# Patient Record
Sex: Female | Born: 1965 | Race: White | Hispanic: No | Smoking: Current every day smoker
Health system: Southern US, Community
[De-identification: ages and names within clinical notes are randomized; demographics above are authoritative.]

## PROBLEM LIST (undated history)

## (undated) DIAGNOSIS — M545 Low back pain, unspecified: Secondary | ICD-10-CM

## (undated) DIAGNOSIS — G8929 Other chronic pain: Secondary | ICD-10-CM

## (undated) DIAGNOSIS — G43909 Migraine, unspecified, not intractable, without status migrainosus: Secondary | ICD-10-CM

## (undated) DIAGNOSIS — F418 Other specified anxiety disorders: Secondary | ICD-10-CM

## (undated) DIAGNOSIS — R569 Unspecified convulsions: Secondary | ICD-10-CM

## (undated) HISTORY — DX: Other specified anxiety disorders: F41.8

## (undated) HISTORY — PX: OTHER SURGICAL HISTORY: SHX169

## (undated) HISTORY — DX: Other chronic pain: G89.29

## (undated) HISTORY — DX: Migraine, unspecified, not intractable, without status migrainosus: G43.909

## (undated) HISTORY — DX: Low back pain: M54.5

## (undated) HISTORY — DX: Unspecified convulsions: R56.9

## (undated) HISTORY — DX: Low back pain, unspecified: M54.50

---

## 2007-04-03 ENCOUNTER — Ambulatory Visit (HOSPITAL_COMMUNITY): Admission: RE | Admit: 2007-04-03 | Discharge: 2007-04-03 | Payer: Self-pay | Admitting: Neurosurgery

## 2007-04-18 ENCOUNTER — Encounter: Payer: Self-pay | Admitting: Neurosurgery

## 2007-04-30 ENCOUNTER — Inpatient Hospital Stay (HOSPITAL_COMMUNITY): Admission: RE | Admit: 2007-04-30 | Discharge: 2007-05-08 | Payer: Self-pay | Admitting: Interventional Radiology

## 2007-05-01 ENCOUNTER — Encounter (INDEPENDENT_AMBULATORY_CARE_PROVIDER_SITE_OTHER): Payer: Self-pay | Admitting: Neurosurgery

## 2007-08-16 ENCOUNTER — Ambulatory Visit (HOSPITAL_COMMUNITY): Admission: RE | Admit: 2007-08-16 | Discharge: 2007-08-16 | Payer: Self-pay | Admitting: Neurosurgery

## 2009-06-17 IMAGING — CT CT HEAD W/O CM
1 series · 16 of 28 positions shown, 20 images · IV contrast (agent unspecified)
Comparison: 05/03/07.

CLINICAL DATA: Intracranial mass.  Status post resection.  
HEAD CT WITHOUT CONTRAST:
TECHNIQUE: Contiguous axial images were obtained from the base of the skull through the vertex according to standard protocol without contrast.

[Series 2: brain · axial · 0.42mm/px · z∈[+119,+247]mm · 16 of 28 slices shown, 20 images]
[im 2/28  brain]
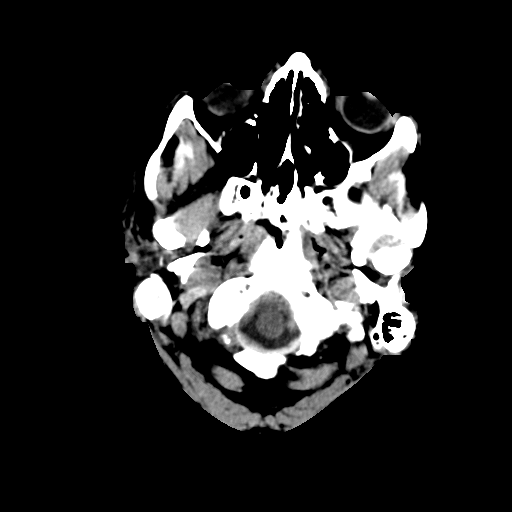
[im 2/28  bone]
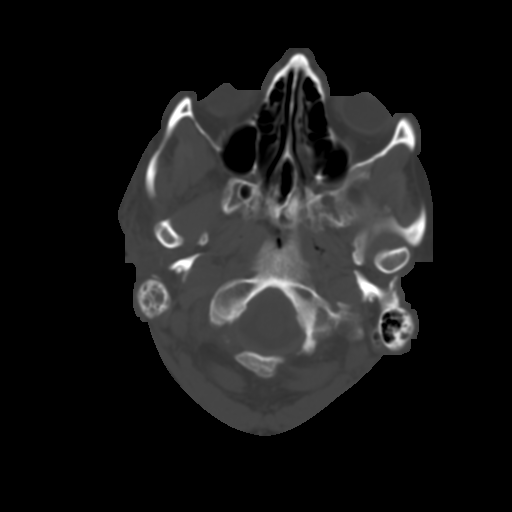
[im 4/28  brain]
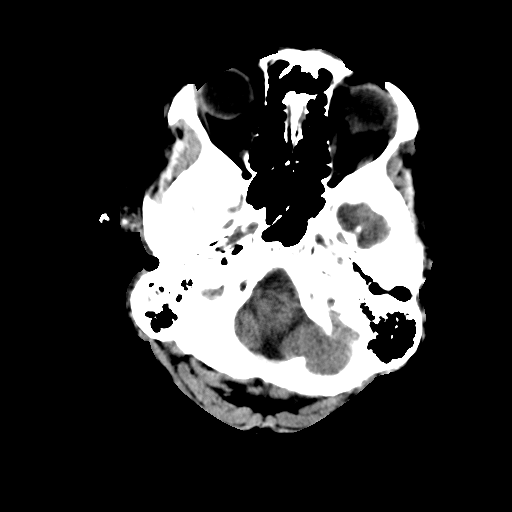
[im 6/28  brain]
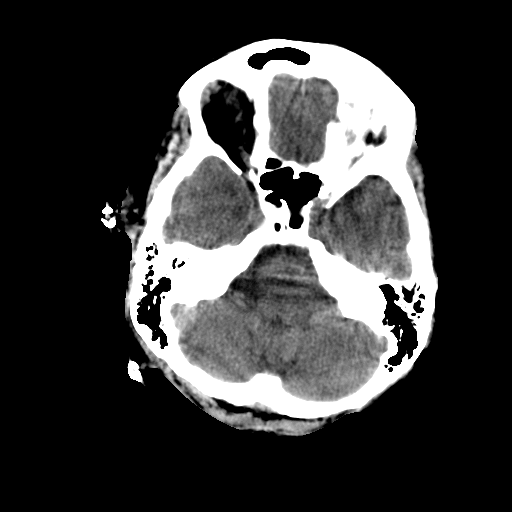
[im 7/28  brain]
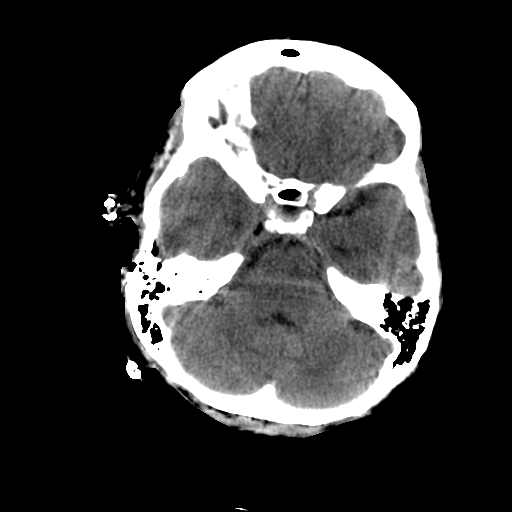
[im 9/28  brain]
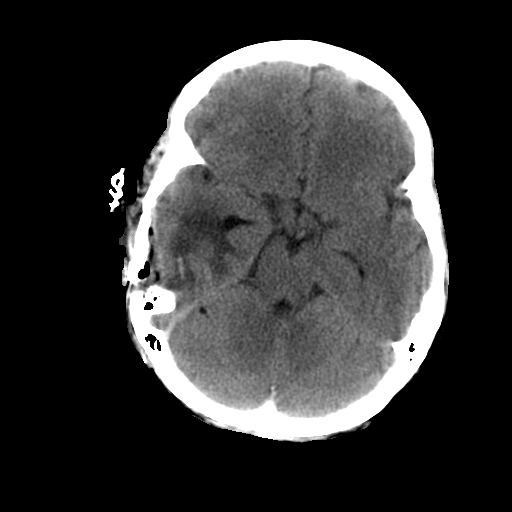
[im 9/28  bone]
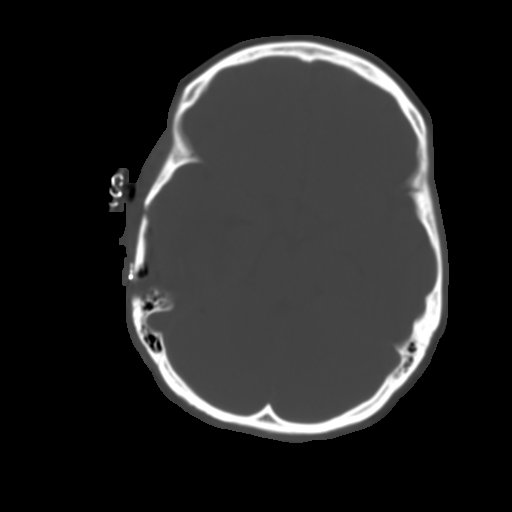
[im 10/28  brain]
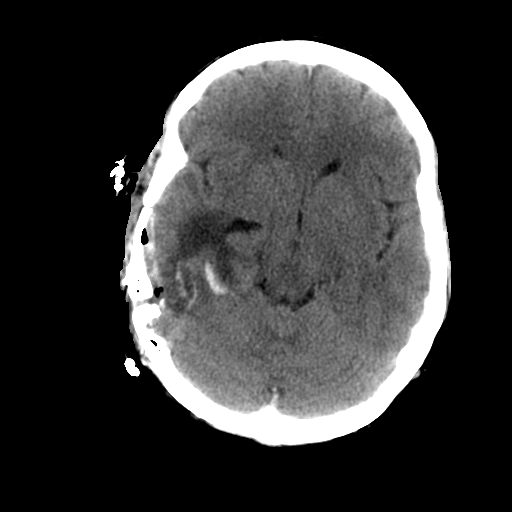
[im 12/28  brain]
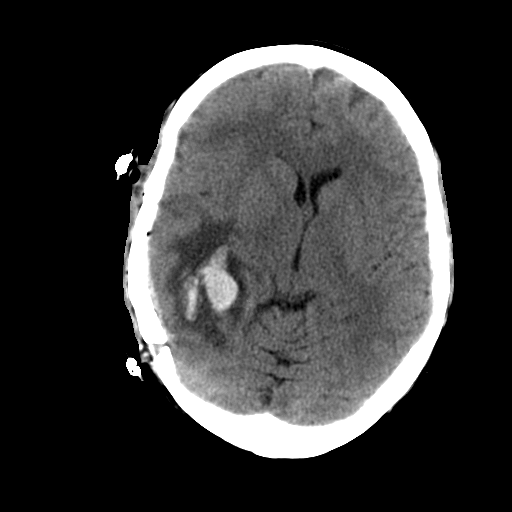
[im 14/28  brain]
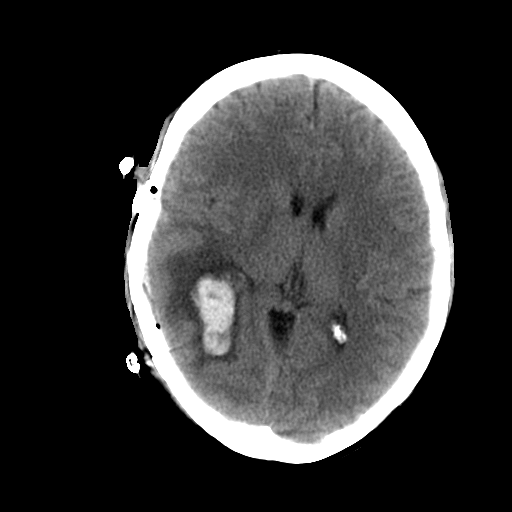
[im 15/28  brain]
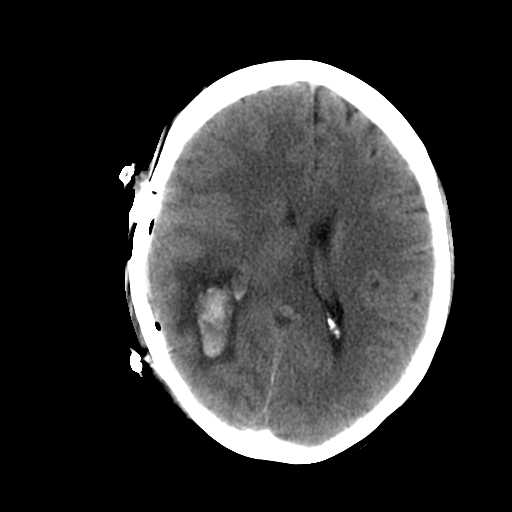
[im 15/28  bone]
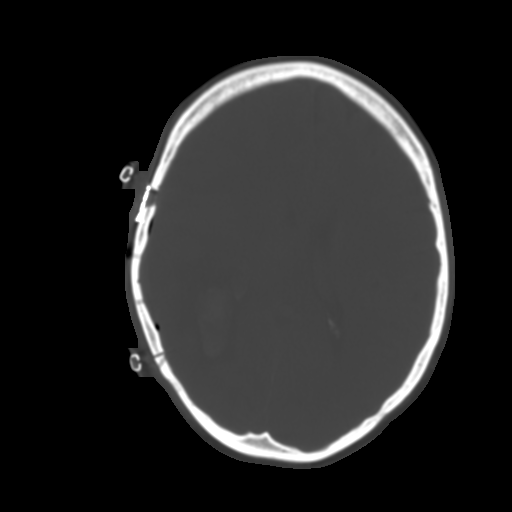
[im 17/28  brain]
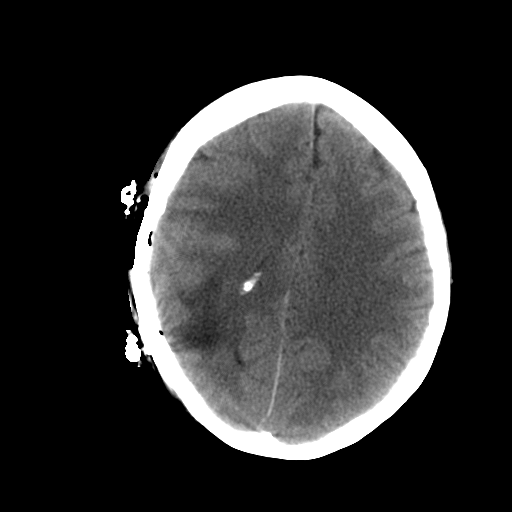
[im 19/28  brain]
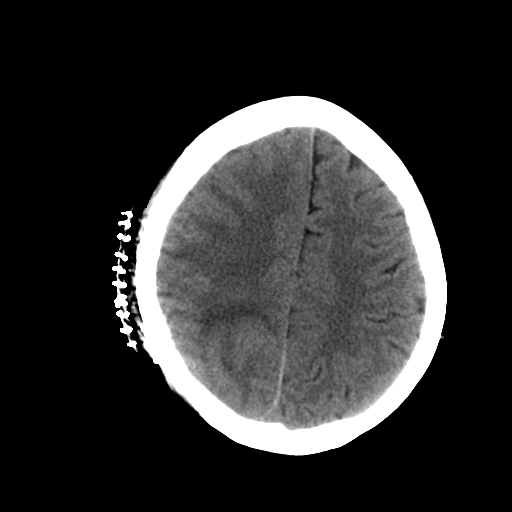
[im 20/28  brain]
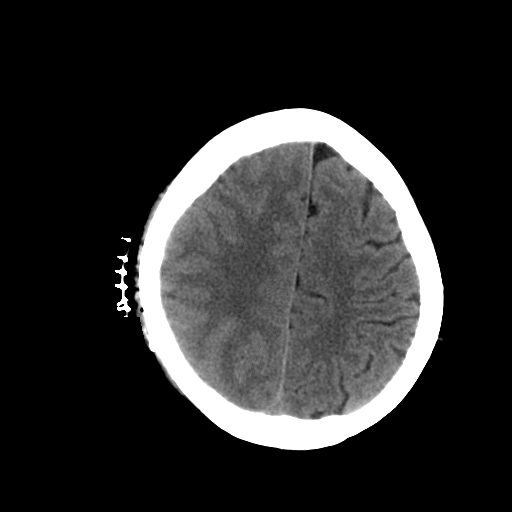
[im 22/28  brain]
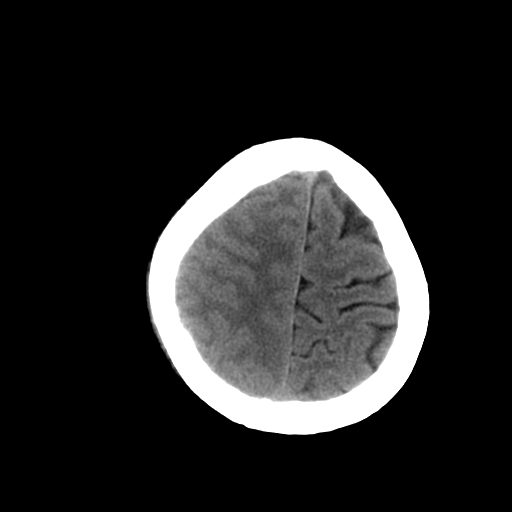
[im 22/28  bone]
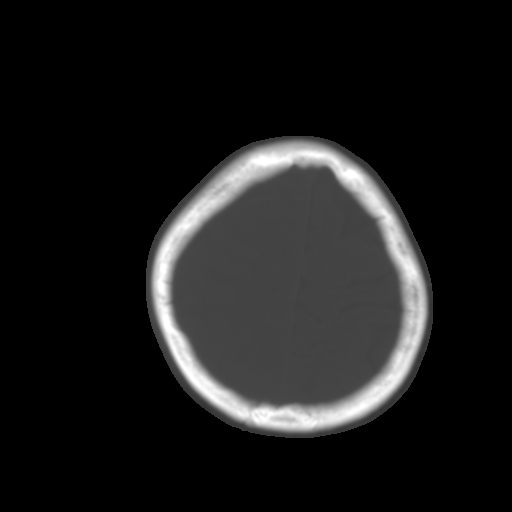
[im 23/28  brain]
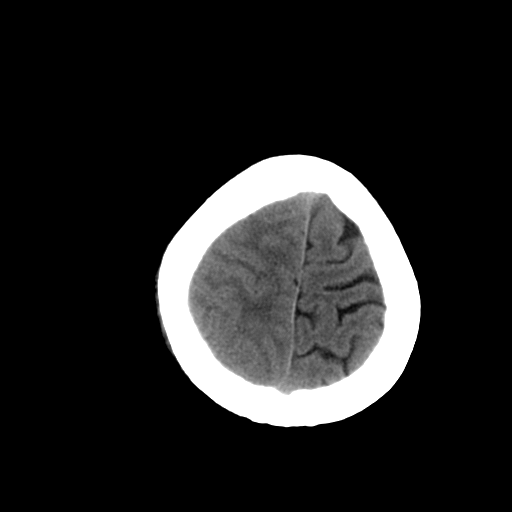
[im 25/28  brain]
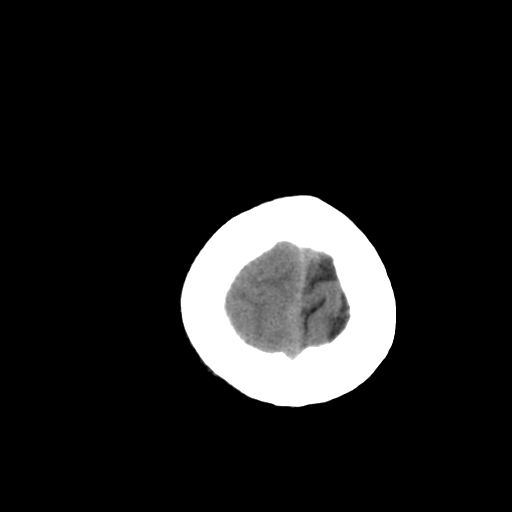
[im 27/28  brain]
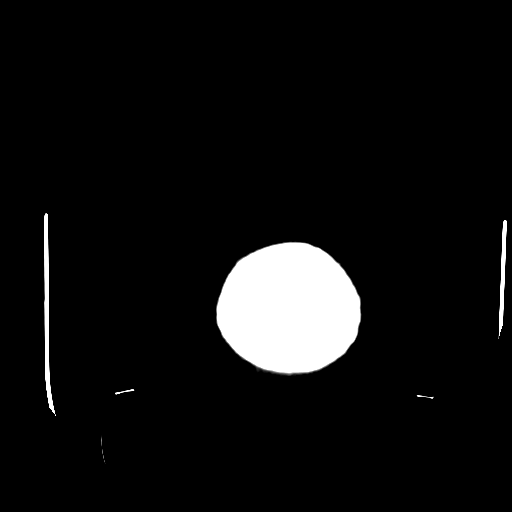

[16 of 28 positions shown; findings below may reference images not displayed]

FINDINGS: Again seen is postoperative change of right temporal craniotomy.  Hematoma in the right temporal lobe extending into the parietal lobe is not markedly changed to slightly decreased.  Surrounding edema is again noted.  No definite intraventricular blood.  Right to left midline shift of 0.6 cm is unchanged.  Prominence of the right temporal horn of the lateral ventricle suggesting entrapment is again noted.  Slight asymmetry of the perimesencephalic cistern is also unchanged.  No definite uncal herniation.  Basal cisterns are patent.  No new hemorrhage.  Bones again demonstrate craniotomy defect.
IMPRESSION: No marked interval change in right temporoparietal hematoma with associated right to left midline shift and potential entrapment of the temporal horn of the right lateral ventricle.

## 2009-12-14 ENCOUNTER — Emergency Department (HOSPITAL_COMMUNITY): Admission: EM | Admit: 2009-12-14 | Discharge: 2009-12-14 | Payer: Self-pay | Admitting: Emergency Medicine

## 2010-09-05 LAB — URINE CULTURE: Colony Count: 15000

## 2010-09-05 LAB — URINALYSIS, ROUTINE W REFLEX MICROSCOPIC
Glucose, UA: NEGATIVE mg/dL
Ketones, ur: NEGATIVE mg/dL
Nitrite: NEGATIVE
Specific Gravity, Urine: 1.031 — ABNORMAL HIGH (ref 1.005–1.030)
Urobilinogen, UA: 0.2 mg/dL (ref 0.0–1.0)
pH: 5 (ref 5.0–8.0)

## 2010-09-05 LAB — POCT I-STAT, CHEM 8
BUN: 17 mg/dL (ref 6–23)
Calcium, Ion: 1.15 mmol/L (ref 1.12–1.32)
Chloride: 106 mEq/L (ref 96–112)
Creatinine, Ser: 1 mg/dL (ref 0.4–1.2)
HCT: 45 % (ref 36.0–46.0)
Hemoglobin: 15.3 g/dL — ABNORMAL HIGH (ref 12.0–15.0)

## 2010-09-05 LAB — URINE MICROSCOPIC-ADD ON

## 2010-11-02 NOTE — H&P (Signed)
NAMEPAMELA, INTRIERI                 ACCOUNT NO.:  [id number]   MEDICAL RECORD NO.:  [id number]          PATIENT TYPE:  AMB   LOCATION:  SDS                          FACILITY:  [facility]   PHYSICIAN:  Marin Roberts, MDDATE OF BIRTH:  01/30/1966   DATE OF ADMISSION:  04/30/2007  DATE OF DISCHARGE:                              HISTORY & PHYSICAL   CHIEF COMPLAINT:  Meningioma, with plans for embolization of the  vascular supply.   HISTORY OF PRESENT ILLNESS:  This is a pleasant 45 year old female  referred to Dr. Marin Roberts through the courtesy of Dr. [name]  after the patient was diagnosed with a meningioma.  The patient has a  long history of migraine headaches which started at approximately age 56  or 18.  The patient had an MRI at [facility] in April  2008 that revealed a meningioma.  The plans were to follow this  conservatively.  She had a repeat MRI on April 03, 2007 after  developing changes in her headache pain.  This MRI revealed a doubling  in size of the meningioma, which now measures 21.7 x 22.5 x 14.7 mm and  is located in the right posterior area.  The plans are for surgery.  The  patient was referred to Dr. Alfredo Batty to discuss embolization of the  vascular supply.  Dr. Alfredo Batty met with the patient on April 18, 2007.  The embolization procedure was described in detail, along with the risks  and benefits.  The patient returns today, April 30, 2007, to be  admitted for a cerebral angiogram and possible embolization if felt to  be safe and indicated.   PAST MEDICAL HISTORY:  1. Degenerative joint disease, with chronic low back pain.  2. She has 2 herniated discs.  3. She has a history of anxiety and depression.  4. She has a history of chronic migraines.  5. She has a long history of tobacco use.  6. She has a history of a radiofrequency ablation for a cardiac      arrhythmia performed in 2003.   SURGICAL HISTORY:  1. Tonsillectomy and  adenoidectomy.  2. Nasal septal repair.   ALLERGIES:  The patient is allergic to NICOTINE PATCHES.   MEDICATIONS:  1. Depo shots q.3 months for birth control.  2. Lexapro 20 mg daily.  3. Ativan 1 mg b.i.d. to  t.i.d. p.r.n.  4. Vicodin 10/500, 2 daily.  5. Soma 350 mg 2-3 times daily.  6. Excedrin Migraine p.r.n.  7. Topamax 200 mg daily.  8. Ambien p.r.n. for sleep.  9. Phenergan p.r.n. nausea.  10.Zyrtec p.r.n.   SOCIAL HISTORY:  The patient is divorced.  She has one child.  She lives  in [place] with her ex-in-laws.  She smokes 1/2 to 1 pack of  cigarettes per day.  She uses alcohol occasionally.  She works as an  Air cabin crew.   FAMILY HISTORY:  There is no family history of meningioma, although she  has 2 great-aunts who had malignant brain tumors.   REVIEW OF SYSTEMS:  Is completely negative, except for  ongoing  headaches.  She has nausea and vomiting associated with her migraines.  She has had some visual changes which she attributes to getting older.  She has had diarrhea recently which she feels is due to anxiety.   LABORATORY DATA:  INR 0.9, PTT 29.  Hemoglobin 12.1, hematocrit 36.3,  WBC 7,200, platelets 302,000.  BUN 10, creatinine 0.74.  GFR is greater  than 60.  Chloride 113, glucose 90, potassium 4.5.  A serum pregnancy  test was negative.   PHYSICAL EXAMINATION:  GENERAL:  Reveals a very pleasant 45 year old  white female in no acute distress.  VITAL SIGNS:  Blood pressure 106/74, pulse 80, respirations 20,  temperature 97, oxygen saturation 98% on room air.  HEENT:  Unremarkable.  NECK:  Reveals no bruits.  HEART:  Reveals regular rate and rhythm, without murmur.  LUNGS:  Clear.  ABDOMEN:  Nontender.  EXTREMITIES:  Reveal pulses to be intact, without edema.  AIRWAY:  Rated at a 1.  ASA SCALE:  Rated at a 2.  NEUROLOGIC:  Revealed the patient to be alert and oriented and following  commands.  Cranial nerves II-XII were grossly intact.   Sensation was  intact to light touch.  Motor strength 5/5 throughout.  Cerebellar  testing was intact.   IMPRESSION:  1. Meningioma, with a recent doubling in size based on an MRI      performed April 03, 2007, with plans for craniotomy and      resection.  Cerebral angiogram to be performed today, with possible      embolization of the vascular supply prior to surgery.  2. History of chronic low back pain, with degenerative joint disease      and herniated discs.  3. History of anxiety and depression.  4. Chronic migraines.  5. Status post previous surgeries, as noted above.  6. Long history of tobacco use.  7. Status post radiofrequency ablation for cardiac arrhythmia in 2003.      Details not available.   PLAN:  As noted, the patient will have a cerebral angiogram today.  Based on those findings, she may require embolization of the vascular  supply of the meningioma prior to the planned resection.      Delton See, P.A.      Marin Roberts, MD  Electronically Signed    DR/MEDQ  D:  04/30/2007  T:  04/30/2007  Job:  [id number]   cc:   Danae Orleans. [name], M.D.  Pasty Spillers. Ivory Broad, M.D.

## 2010-11-02 NOTE — Consult Note (Signed)
Lindsay Newman, Lindsay Newman NO.:  [id number]   MEDICAL RECORD NO.:  [id number]          PATIENT TYPE:  INP   LOCATION:  NA                           FACILITY:  [facility]   PHYSICIAN:  Marin Roberts, MDDATE OF BIRTH:  09/22/1965   DATE OF CONSULTATION:  DATE OF DISCHARGE:                                 CONSULTATION   REFERRING PHYSICIAN:  Danae Orleans. [name], M.D.   CHIEF COMPLAINT:  The patient is referred to [place] by Dr. Danae Orleans. [name]  for consultation for embolization of vascular supply feeding meningoma  prior to open surgery.   HISTORY OF PRESENT ILLNESS:  The patient has a long-standing history of  headaches, specifically migraines.  The patient states she has suffered  from migraine headaches since about the age of 89 or 24.  She states that  her migraines have become worse severe, occurring on a daily basis and  has been unable to find any relief despite multiple medications.  Her  headaches are associated with nausea and vomiting.  She does describe  some visual changes but feels that they may be more due to her age than  due to her migraines.  The patient underwent MRI scan performed on  April 03, 2007, which was compared with outside films from [facility] date September 20, 2006, which reveals a doubly in size.  It was decided due to the change in her meningioma that the patient  should undergo craniotomy to excise this meningioma as well as  endovascular embolization prior to same to control bleeding, to  approximately double the original.   PAST MEDICAL HISTORY:  1. Positive for degenerative joint disease with 2 bulging herniated      disks giving her chronic lower back pain.  2. Anxiety and depression.  3. Chronic migraines as discussed in the HPI.   ALLERGIES:  NO KNOWN DRUG ALLERGIES.   PAST SURGICAL HISTORY:  1. Positive for adenoidectomy.  2. Nasal septal repair.  3. Heart ablation in 2003 secondary to arrhythmia.    MEDICATIONS:  1. Depo shot every 3 months for birth control.  2. Lexapro 20 mg daily for anxiety.  3. Ativan 1 mg b.i.d. to t.i.d. p.r.n. anxiety.  4. Vicodin 10/500 mg 2 times daily.  5. Soma 350 mg 2 to 3 times daily as needed for muscle relaxer.  6. Excedrin on a p.r.n. basis.  7. Topamax 200 mg daily.   SOCIAL HISTORY:  The patient is currently divorced.  She has 1 child.  She lives in [place] currently with her ex-in-laws.  The patient  smokes approximately 1/2 to 1 pack per day cigarettes.  She uses alcohol  on a social basis.  The patient denies recreational drug use.  The  patient currently works as an account.   FAMILY HISTORY:  No history from mother and father of any meningioma.  Patient states that she has 2 great aunts with his of malignant brain  tumors.   REVIEW OF SYSTEMS:  All negative except what is expressed in the history  of present illness.  Procedure for endovascular embolization of meningoma was discussed with  the patient in detail by Dr. Gennette Pac. The patient was informed the  benefits of the procedure would be that to control bleeding prior to  craniotomy procedure.  Complications of the procedure including the  potential for infection, bleeding, vessel damage, embolus and stroke  were discussed with the patient's apparent understanding.  The patient  understands that she will be brought in for a formal arteriogram to  evaluate the vascular supply to this meningioma.  If vessels are noted  to be amenable to the procedure, the patient would be put under general  anesthesia at that time and the endovascular embolization would occur.  All the patient's questions were answered and she is ready to proceed  with the procedure.  Anesthesia has been consulted and they will be  calling the patient to set up the patient for the embolization procedure  to be scheduled prior to her day of surgery with Dr. [name].      Michael Litter, P.A.       Marin Roberts, MD  Electronically Signed    PC/MEDQ  D:  04/18/2007  T:  04/19/2007  Job:  [id number]   cc:   Neale Burly, MD

## 2010-11-02 NOTE — Op Note (Signed)
**Note Lindsay Newman** NAMECARIGAN, LISTER                 ACCOUNT NO.:  [id number]   MEDICAL RECORD NO.:  [id number]          PATIENT TYPE:  OIB   LOCATION:  3113                         FACILITY:  [facility]   PHYSICIAN:  Danae Orleans. [name], M.D.  DATE OF BIRTH:  [birth year]   DATE OF PROCEDURE:  05/01/2007  DATE OF DISCHARGE:                               OPERATIVE REPORT   PREOPERATIVE DIAGNOSIS:  Right posterior temporal tentorial meningioma.   POSTOPERATIVE DIAGNOSIS:  Right posterior temporal tentorial meningioma.   PROCEDURES:  1. Placement of lumbar drain.  2. Posterior temporal craniotomy and resection of tentorial      meningioma.  3. Microdissection.   SURGEON:  Danae Orleans. [name], M.D.   ASSISTANTS:  1. Hewitt Shorts, M.D.  2. Georgiann Cocker, RN.   ANESTHESIA:  General endotracheal anesthesia.   ESTIMATED BLOOD LOSS:  300 mL.   COMPLICATIONS:  None.   DISPOSITION:  To recovery.   INDICATIONS:  Lindsay Newman is a 45 year old woman with a posterior  temporal tentorially based meningioma which had doubled in size on  interval scans over a 76-month period.  It was elected to take her to  surgery for craniotomy and resection of this tumor.  She was admitted  the day before for endovascular obliteration of her embolization of  feeding vessels to the tumor.   PROCEDURE:  Ms. Szczerba was brought to the operating room.  After  satisfactory smooth and uncomplicated induction of general endotracheal  anesthesia and placement of intravenous lines, the patient was placed in  a left lateral position.  Her low back was prepped and draped in the  usual sterile fashion and a lumbar drain was inserted with good return  of CSF.  Subsequently, she was placed in a park bench position with  axillary roll and soft tissue and bony prominences were padded  appropriately.  She was placed in three pin head fixation with the right  side of her head elevated.  Her right periauricular scalp was shaved and  prepped and  draped in the usual sterile fashion.  An incision was made,  marked overlying just anterior to the ear, curving up over the temporal  squama and then posteriorly to overlying the postauricular region in the  region of the mastoid air cells.  This was then infiltrated with 0.25%  Marcaine, 0.5% lidocaine and 1:200,000 epinephrine.  This incision was  then made, carried to the temporalis fascia.  Raney clips were applied.  The temporalis fascia was then incised sharply and then the temporalis  muscle was cut with the electrocautery.  The scalp flap was brought down  overlying the ear to the level of the base of the skull.  The craniotome  was then used to produce trephines anterosuperiorly, posteriorly and  posteroinferiorly.  The transverse sinus was identified and protected.  A bone flap was then elevated and an additional bony removal along the  base of the cell was performed with a high-speed drill and air cells  were encountered and packed with wax.  The dura was then opened and  after dural tack-up stitches  were placed and the floor of the temporal  fossa was identified, there were large draining veins which were  preserved.  The vein of Labbe was identified and preserved.  The  transverse sinus was also protected.  The posterior temporal lobe was  then elevated after drainage of CSF and drainage of the lumbar drain.  The patient had been mildly hyperventilated and also been given  mannitol.  Budde halo retractor system was employed and up on placing  the retractor blade, the tumor was identified.  This was a fairly  vascular tumor.  It appeared to be soft which is likely secondary to  embolization of the vascular feeders.  The tumor was removed with  suction cautery technique and also utilizing the CUSA.  Preliminary  pathology was consistent with a meningioma.  Additional material was  sent for permanent section.  The base of the tumor along the tentorium  appeared to be quite  vascular and the blood supply was taken out with  bipolar electrocautery and the tumor was then completely debulked.  The  deepest part of the tumor was then identified and carefully removed and  there was a leash of fine posterior cerebral branch feeders which were  cauterized on their entrance to the tumor and the tumor was then removed  in its entirety.  The venous structures were inspected and felt to be in  good repair.  There was no evidence of any remaining tumor.  The dura  was identified all the way to the edge of the incised dura.  Hemostasis  was assured.  There was free flow of CSF.  The microscope was removed  and field dura was then closed.  A bone flap was replaced with plates  and the temporalis fascia was closed with 2-0 Vicryl sutures.  The galea  reapproximated with 2-0 Vicryl sutures and the skin edges were  approximated with staples.  The wound was dressed with a sterile  occlusive head wrap.  The lumbar drain was removed at the end of the  case.  The patient was extubated in the operating room and taken to  recovery in stable satisfactory condition, having tolerated the  procedure well.      Danae Orleans. [name], M.D.  Electronically Signed     JDS/MEDQ  D:  05/01/2007  T:  05/01/2007  Job:  [id number]

## 2010-11-02 NOTE — Discharge Summary (Signed)
NAMEANTONISHA, Lindsay Newman                 ACCOUNT NO.:  [id number]   MEDICAL RECORD NO.:  [id number]          PATIENT TYPE:  INP   LOCATION:  3040                         FACILITY:  [facility]   PHYSICIAN:  Danae Orleans. [name], M.D.  DATE OF BIRTH:  04-06-1966   DATE OF ADMISSION:  04/30/2007  DATE OF DISCHARGE:  05/08/2007                               DISCHARGE SUMMARY   REASON FOR ADMISSION:  Right tentorial meningioma which was enlarging in  size on serial MRI scans.   FINAL DIAGNOSIS:  Right tentorial meningioma which was enlarging in size  on serial MRI scans.   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  She had an MRI which  demonstrated a right temporal tentorial based meningioma measuring 17 x  14 x 9.5 mm.  She came back with a follow up evaluation and this  demonstrated significant interval enlargement in the tumor, and it was  elected to take her to surgery for resection of this tumor.  She was  admitted the day before surgery for endovascular embolization of  meningeal feeding blood vessels to the tumor, and this was done by Dr.  Gennette Pac without complication.  The patient was then taken to  surgery on May 01, 2007, nd underwent a right posterior temporal  craniotomy with resection of the tentorial meningioma with  microdissection.  The surgery was uncomplicated.  Postoperatively, she  was awake, alert, and appropriate on May 01, 2007.  However, on  May 02, 2007, she was complaining of a headache and was a little  bit confused.  On May 03, 2007, she was somewhat less oriented.  I  therefore obtained a CT scan which showed that she had developed an  interval blood clot just superior to the tumor resection cavity and that  she had some cerebral edema, and mass effect.  The patient was placed on  steroids, was made  n.p.o., and then gradually had clearing of  consciousness and complete resolution of her neurologic problems.  On  May 06, 2007, she was alert and appropriate with a mild left-sided  drift.  On May 07, 2007, she had no drift and was entirely  appropriate.  Had somewhat unsteady gait.  She was then managed in  conjunction with a physical therapist on the floor, and was felt to have  steady gait without problems with her ambulation, and did not require  any more physical therapy.  On May 08, 2007, she was awake, alert,  conversant.  Had no drift.  No visual problems.  No orientation  problems, and minimal headache.  She was discharged home at this point  with a Decadron taper, with Keppra 500 mg b.i.d., Pepcid 20 mg b.i.d.,  and Percocet 5/325 one to two every 4 to 6 hours as needed for pain.   Instructions were to take it easy at home.  No lifting, bending,  twisting, no driving.   To go back on her  admission medications of Lexapro 20 mg daily, Topamax  200 mg at bedtime, Ambien 10 mg at bedtime, hydrocodone as needed, Soma  350 mg two times daily as needed, lorazepam 1 mg as needed, Phenergan  25 mg as needed, Zyrtec 10 mg daily as needed.  She is advised to hold  on her Excedrin migraine formulation, and was cleared to get her Depo-  Provera injections.   She will follow up with me in the office in 6 days for staple removal.      Danae Orleans. [name], M.D.  Electronically Signed     JDS/MEDQ  D:  05/08/2007  T:  05/08/2007  Job:  [id number]

## 2011-03-29 LAB — BASIC METABOLIC PANEL
BUN: 10
BUN: 10
BUN: 7
CO2: 18 — ABNORMAL LOW
CO2: 18 — ABNORMAL LOW
CO2: 24
Calcium: 8.8
Calcium: 8.9
Calcium: 9.1
Calcium: 9.1
Chloride: 111
Chloride: 112
Chloride: 114 — ABNORMAL HIGH
Creatinine, Ser: 0.69
Creatinine, Ser: 0.76
Creatinine, Ser: 0.86
GFR calc Af Amer: >60
GFR calc Af Amer: >60
GFR calc Af Amer: >60
GFR calc non Af Amer: >60
GFR calc non Af Amer: >60
GFR calc non Af Amer: >60
Glucose, Bld: 124 — ABNORMAL HIGH
Glucose, Bld: 126 — ABNORMAL HIGH
Potassium: 3.1 — ABNORMAL LOW
Potassium: 3.3 — ABNORMAL LOW
Potassium: 4.5
Sodium: 140
Sodium: 140
Sodium: 141

## 2011-03-29 LAB — APTT: aPTT: 29

## 2011-03-29 LAB — POCT I-STAT 7, (LYTES, BLD GAS, ICA,H+H)
Bicarbonate: 17.4 — ABNORMAL LOW
Hemoglobin: 10.5 — ABNORMAL LOW
O2 Saturation: 100
Operator id: 122891
Patient temperature: 36.2
pH, Arterial: 7.278 — ABNORMAL LOW
pO2, Arterial: 506 — ABNORMAL HIGH

## 2011-03-29 LAB — DIFFERENTIAL
Basophils Absolute: 0
Basophils Absolute: 0
Basophils Absolute: 0.1
Basophils Relative: 0
Basophils Relative: 0
Eosinophils Absolute: 0 — ABNORMAL LOW
Eosinophils Absolute: 0 — ABNORMAL LOW
Eosinophils Absolute: 0.4
Eosinophils Relative: 0
Eosinophils Relative: 0
Eosinophils Relative: 5
Lymphocytes Relative: 47 — ABNORMAL HIGH
Lymphocytes Relative: 7 — ABNORMAL LOW
Lymphocytes Relative: 8 — ABNORMAL LOW
Lymphs Abs: 0.7
Lymphs Abs: 1
Monocytes Absolute: 0.3
Monocytes Absolute: 0.8
Monocytes Relative: 3
Monocytes Relative: 6
Neutro Abs: 11.6 — ABNORMAL HIGH
Neutro Abs: 8.9 — ABNORMAL HIGH
Neutrophils Relative %: 40 — ABNORMAL LOW
Neutrophils Relative %: 87 — ABNORMAL HIGH
Neutrophils Relative %: 90 — ABNORMAL HIGH

## 2011-03-29 LAB — CROSSMATCH
ABO/RH(D): A NEG
Antibody Screen: NEGATIVE

## 2011-03-29 LAB — CBC
HCT: 29.7 — ABNORMAL LOW
HCT: 31.4 — ABNORMAL LOW
Hemoglobin: 10.1 — ABNORMAL LOW
Hemoglobin: 10.8 — ABNORMAL LOW
MCHC: 33.4
MCHC: 33.9
MCHC: 34.3
MCV: 92.6
MCV: 93.6
Platelets: 215
Platelets: 244
RBC: 3.18 — ABNORMAL LOW
RBC: 3.39 — ABNORMAL LOW
RDW: 13.5
RDW: 13.6
RDW: 13.7
WBC: 13.4 — ABNORMAL HIGH
WBC: 9.9

## 2011-03-29 LAB — PROTIME-INR: Prothrombin Time: 11.9

## 2011-03-29 LAB — POTASSIUM: Potassium: 4.2

## 2011-03-29 LAB — HCG, SERUM, QUALITATIVE: Preg, Serum: NEGATIVE

## 2011-03-29 LAB — SODIUM: Sodium: 139

## 2011-03-29 LAB — ABO/RH: ABO/RH(D): A NEG

## 2012-01-28 IMAGING — CT CT HEAD W/O CM
1 series · 16 of 30 positions shown, 20 images · non-contrast
Comparison: 05/04/2007

CLINICAL DATA: Seizure/history of meningioma resection 2.5 years
ago.

CT HEAD WITHOUT CONTRAST
TECHNIQUE: Contiguous axial images were obtained from the base of
the skull through the vertex without contrast.

[Series 2: head_seq 4.5 h37s st · axial · 0.43mm/px · z∈[+1217,+1361]mm · 16 of 36 slices shown, 20 images]
[im 2/36  brain]
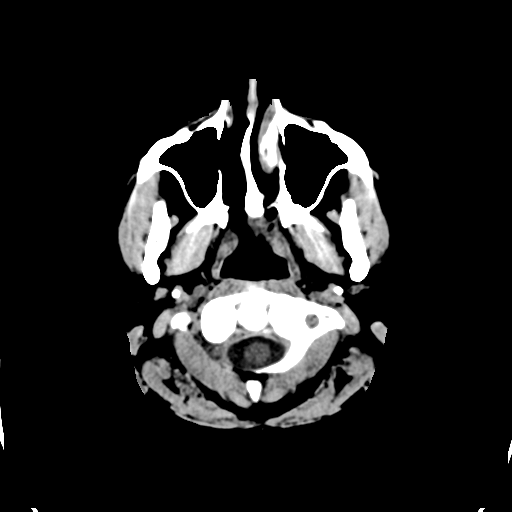
[im 2/36  bone]
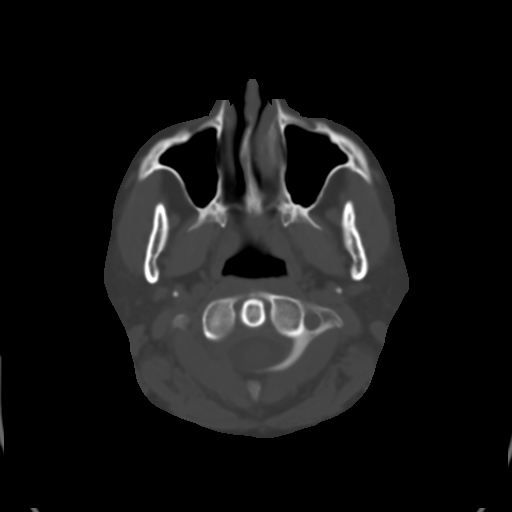
[im 4/36  brain]
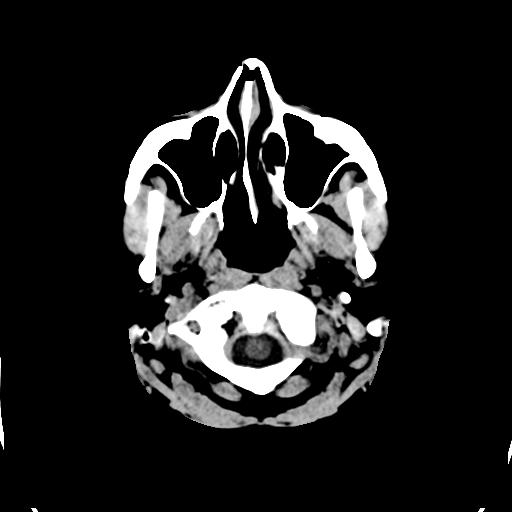
[im 7/36  brain]
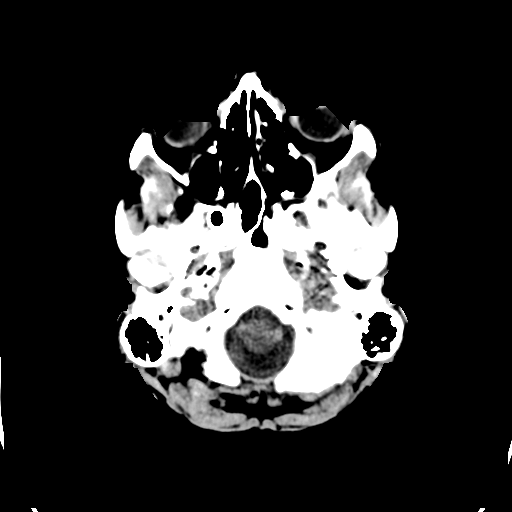
[im 9/36  brain]
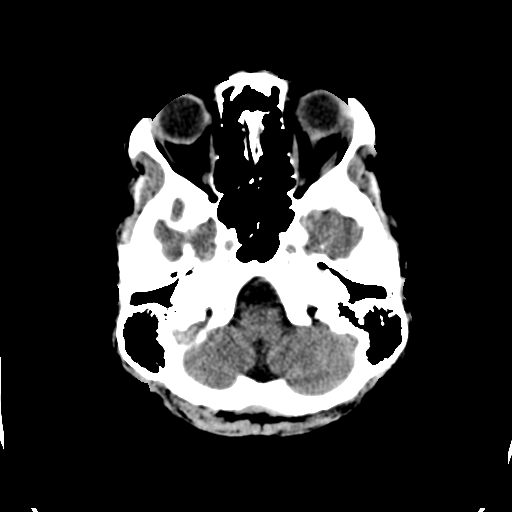
[im 10/36  brain]
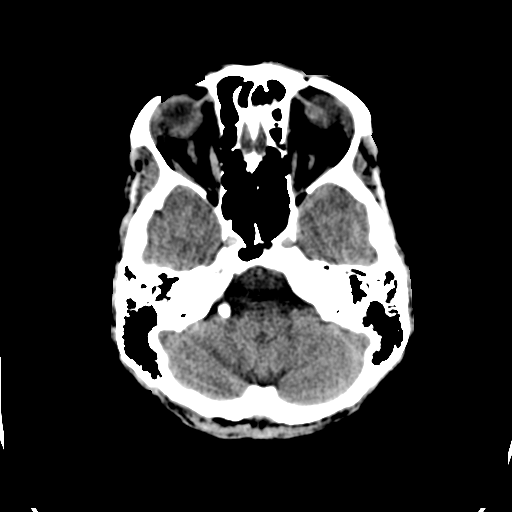
[im 10/36  bone]
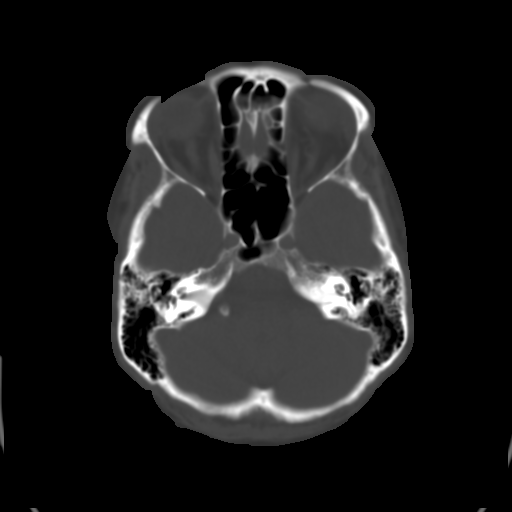
[im 13/36  brain]
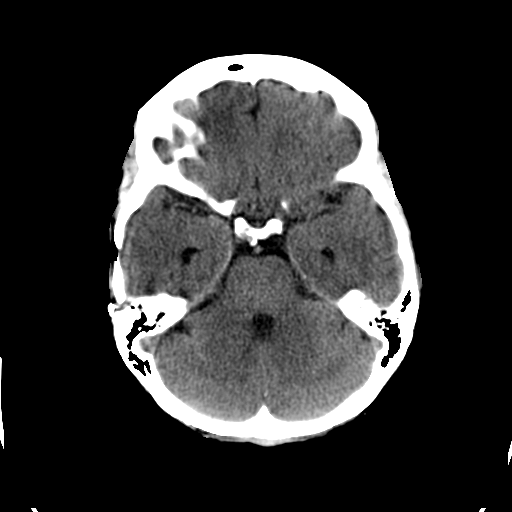
[im 15/36  brain]
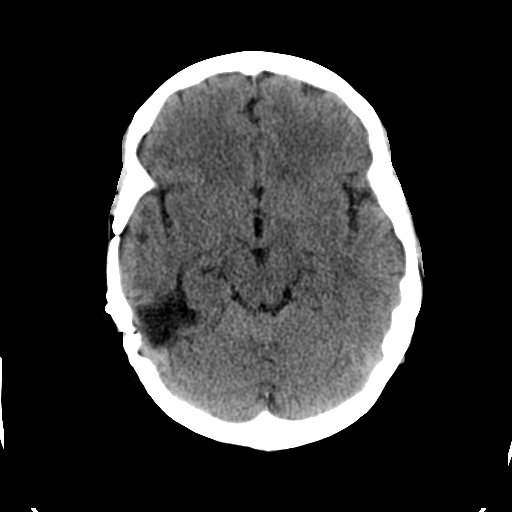
[im 17/36  brain]
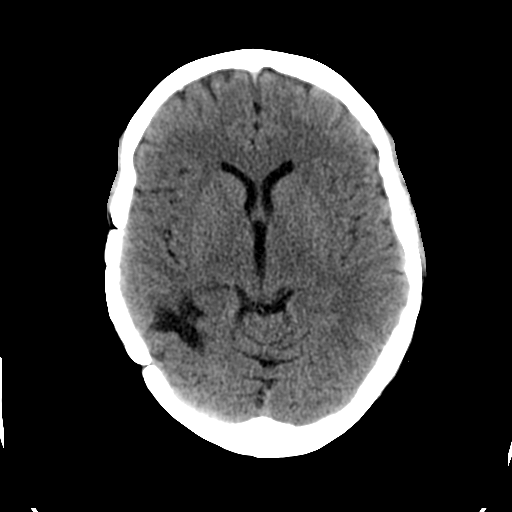
[im 19/36  brain]
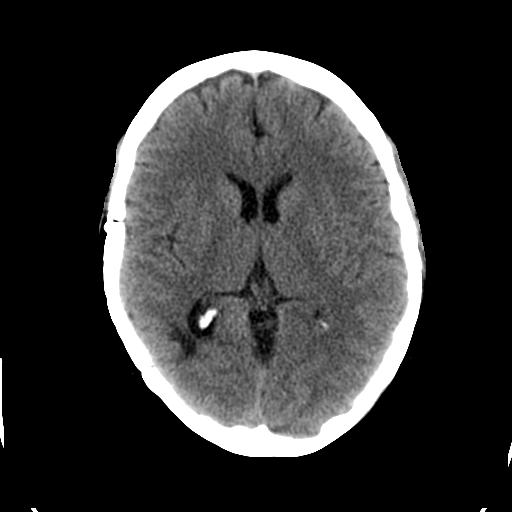
[im 19/36  bone]
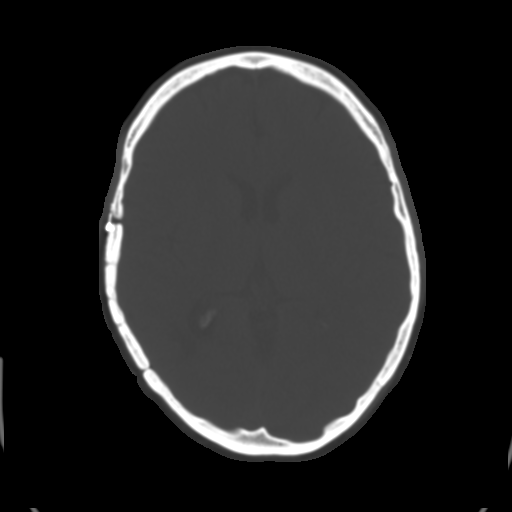
[im 21/36  brain]
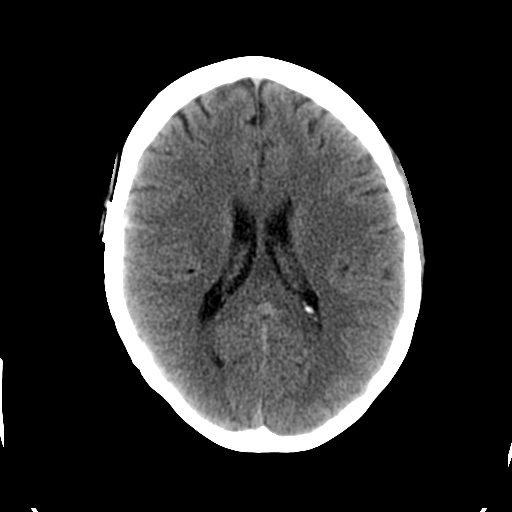
[im 23/36  brain]
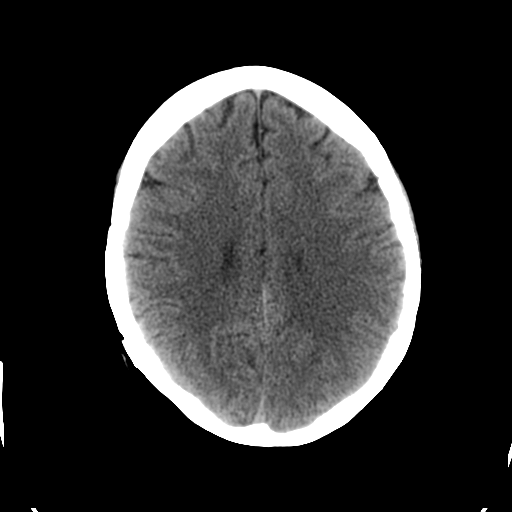
[im 26/36  brain]
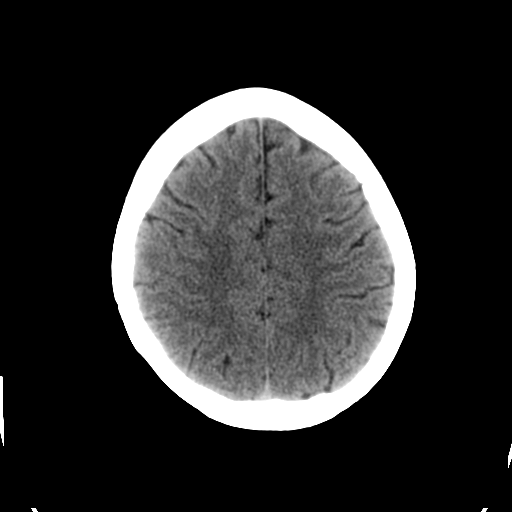
[im 27/36  brain]
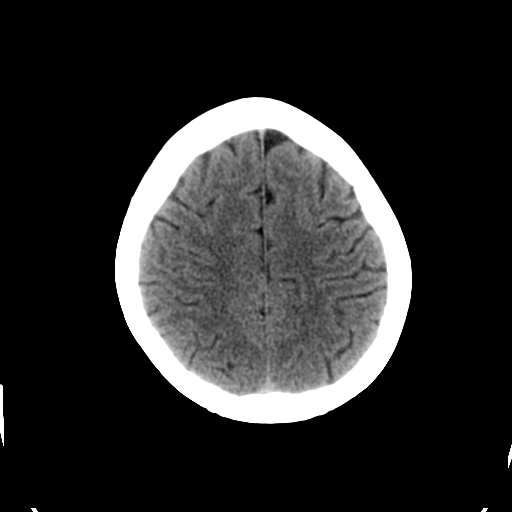
[im 27/36  bone]
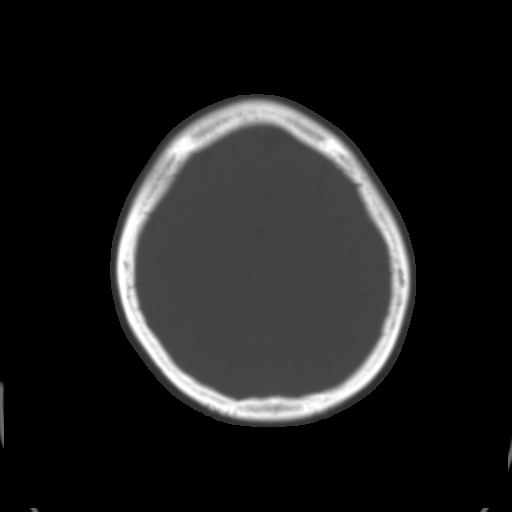
[im 29/36  brain]
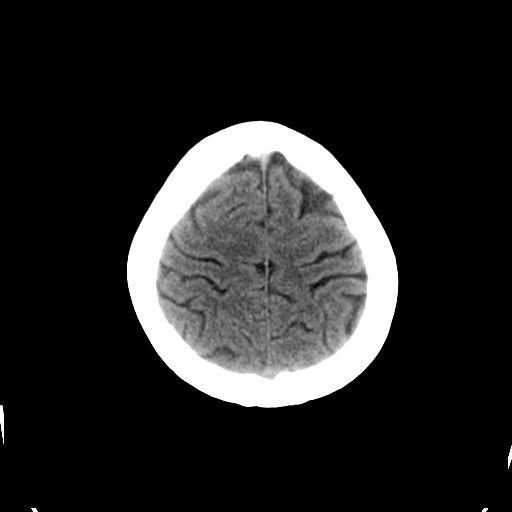
[im 32/36  brain]
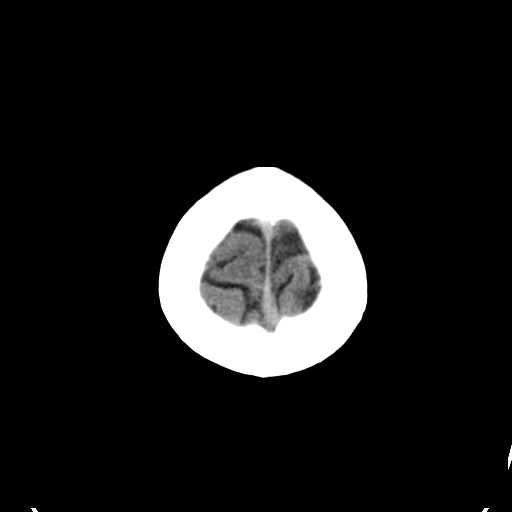
[im 34/36  brain]
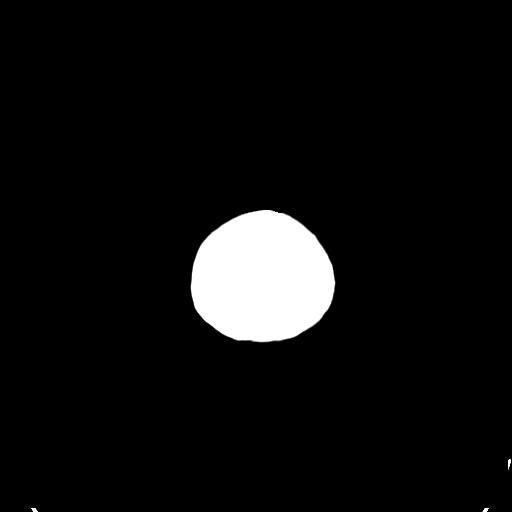

[16 of 30 positions shown; findings below may reference images not displayed]

FINDINGS: Status post right temporal parietal craniotomy.  There is
encephalomalacia at the operative site at the right posterior
temporal parietal region.  There is currently no mass effect or
midline shift.  No extra-axial fluid collections.  No intracranial
hemorrhage. Calvarium unremarkable except for the postoperative
changes on the right.  No fluid in the sinuses visualized.
IMPRESSION: 1.  Status post resection of meningioma from the right posterior
temporal parietal region.
2.  No acute findings.

## 2012-10-16 ENCOUNTER — Telehealth: Payer: Self-pay | Admitting: Neurology

## 2012-10-16 NOTE — Telephone Encounter (Signed)
I haven;t gotten anything from the pharmacy.  Patient will need to schedule annual appt.  I called the patient to see what pharmacy they use.  Got no answer.  Left message.

## 2012-10-16 NOTE — Telephone Encounter (Signed)
Patient left message that she needs a refill for her generic Keppra.  She said the pharmacy has sent 2 requests but hasn't heard anything.  Lindsay Newman it looks like she hasn't been seen in a year.  Let me know if I can do anything.

## 2012-10-17 MED ORDER — LEVETIRACETAM 500 MG PO TABS: ORAL_TABLET | ORAL | Status: DC

## 2012-10-17 NOTE — Telephone Encounter (Signed)
Patient called front desk and said she wants rx sent to [facility].  She needs enough to last until her appt on 06/27

## 2012-12-14 ENCOUNTER — Ambulatory Visit: Payer: Self-pay | Admitting: Nurse Practitioner

## 2013-03-06 ENCOUNTER — Encounter: Payer: Self-pay | Admitting: Nurse Practitioner

## 2013-03-07 ENCOUNTER — Encounter: Payer: Self-pay | Admitting: Nurse Practitioner

## 2013-03-07 ENCOUNTER — Ambulatory Visit (INDEPENDENT_AMBULATORY_CARE_PROVIDER_SITE_OTHER): Payer: Self-pay | Admitting: Nurse Practitioner

## 2013-03-07 VITALS — BP 131/80 | HR 74 | Ht 65.0 in | Wt 171.0 lb

## 2013-03-07 DIAGNOSIS — R413 Other amnesia: Secondary | ICD-10-CM | POA: Insufficient documentation

## 2013-03-07 DIAGNOSIS — D320 Benign neoplasm of cerebral meninges: Secondary | ICD-10-CM | POA: Insufficient documentation

## 2013-03-07 MED ORDER — LEVETIRACETAM 500 MG PO TABS: ORAL_TABLET | ORAL | Status: DC

## 2013-03-07 NOTE — Progress Notes (Signed)
[facility]  PATIENT: Lindsay Newman DOB: 10-28-1965   REASON FOR VISIT: follow up for seizure disorder   HISTORY OF PRESENT ILLNESS: Lindsay Newman , 47 year old returns for follow-up. She was last seen 10/11/11.  She is  alone at today's visit.  She has past medical history of depression, migraine headaches, right posterior temporal tentorial meningioma, status post resection by Dr. [name] in November 2008, presenting with complex partial seizure, with secondary generalization.   In 2008, she presenting with frequent headaches, imaging study has confirmed the meningioma, underwent resection uneventfully, which does help her headache, she did not have seizure, pre and post surgrically, until about 9 months ago, she began to have episode of acute onset of dizziness, stomach ache, lasting less than one minute, but there was no generalized tonic-clonic activity, until June 27.  She was talking with her Production assistant, radio, suddenly felt she is going to faint, lost consciousness  from then on, she was described to have generalized tonic-clonic seizure activity, last for one minute.  She was taken to the emergency room, CT of the brain has showed postsurgical changes at the right temporal region, no acute lesion,  she was started on Keppra 500 b.i.d., she tolerated it very well. TODAY:03/07/13: Last seizure was 02/02/10. She is currently taking  Keppra  500mg  am, 1000mg  hs. No further events. EEG was abnormal with slowing in right post craniotomy site. No epileptiform discharges seen.  She denies lateralized motor or sensory deficit, she is currently a Consulting civil engineer . She does not have medical insurance   REVIEW OF SYSTEMS: Full 14 system review of systems performed and notable only for:  Constitutional: N/A  Cardiovascular: N/A  Ear/Nose/Throat: N/A  Skin: N/A  Eyes: N/A  Respiratory: N/A  Gastroitestinal: N/A  Hematology/Lymphatic: N/A  Endocrine: N/A Musculoskeletal:N/A    Allergy/Immunology: N/A  Neurological: N/A Psychiatric: Anxiety not enough sleep, insomnia  ALLERGIES: Allergies  Allergen Reactions  . Keflex [Cephalexin]     HOME MEDICATIONS: Outpatient Prescriptions Prior to Visit  Medication Sig Dispense Refill  . levETIRAcetam (KEPPRA) 500 MG tablet One po qam and two po qhs  90 tablet  2  . LORazepam (ATIVAN) 1 MG tablet Take 1 mg by mouth every 8 (eight) hours as needed for anxiety.      Marland Kitchen zolpidem (AMBIEN) 10 MG tablet Take 10 mg by mouth at bedtime as needed for sleep.      Marland Kitchen escitalopram (LEXAPRO) 20 MG tablet Take 20 mg by mouth daily.       No facility-administered medications prior to visit.    PAST MEDICAL HISTORY: Past Medical History  Diagnosis Date  . Depression with anxiety   . Seizure   . Migraine   . Chronic pain   . Lower back pain     PAST SURGICAL HISTORY: Past Surgical History  Procedure Laterality Date  . Parietal meningeoma    . Ablation arthmia      FAMILY HISTORY: Family History  Problem Relation Age of Onset  . Heart disease      SOCIAL HISTORY: History   Social History  . Marital Status: Divorced    Spouse Name: N/A    Number of Children: 1  . Years of Education: college   Occupational History  . Not on file.   Social History Main Topics  . Smoking status: Current Every Day Smoker -- 1.00 packs/day    Types: Cigarettes  . Smokeless tobacco: Never Used  . Alcohol Use: Yes  Comment: social drinker   . Drug Use: No  . Sexual Activity: Not on file   Other Topics Concern  . Not on file   Social History Narrative   Patient is a Consulting civil engineer.   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   Patient has 2 years of college.   Patient is divorced.   Patient has 1 child.           PHYSICAL EXAM  Filed Vitals:   03/07/13 0912  BP: 131/80  Pulse: 74  Height: 5\' 5"  (1.651 m)  Weight: 171 lb (77.565 kg)   Body mass index is 28.46 kg/(m^2).  Generalized: Well developed, in no acute distress   Head: normocephalic and atraumatic,. Oropharynx benign  Neck: Supple, no carotid bruits  Cardiac: Regular rate rhythm, no murmur   Neurological examination   Mentation: Alert oriented to time, place, history taking. Follows all commands speech and language fluent  Cranial nerve II-XII: Pupils were equal round reactive to light extraocular movements were full, visual field were full on confrontational test. Facial sensation and strength were normal. hearing was intact to finger rubbing bilaterally. Uvula tongue midline. head turning and shoulder shrug and were normal and symmetric.Tongue protrusion into cheek strength was normal. Motor: normal bulk and tone, full strength in the BUE, BLE, fine finger movements normal, no pronator drift. No focal weakness Coordination: finger-nose-finger, heel-to-shin bilaterally, no dysmetria Reflexes: Brachioradialis 2/2, biceps 2/2, triceps 2/2, patellar 2/2, Achilles 2/2, plantar responses were flexor bilaterally. Gait and Station: Rising up from seated position without assistance, normal stance,  moderate stride, good arm swing, smooth turning, able to perform tiptoe, and heel walking without difficulty.   DIAGNOSTIC DATA (LABS, IMAGING, TESTING) - None to review  ASSESSMENT AND PLAN  47 y.o. year old female  has a past medical history of Depression with anxiety; Seizure; Migraine; Chronic pain; and Lower back pain. to followup for her seizure disorder that has been stable. Last seizure in 2011. Currently on Keppra without side effects  Continue Keppra at current dose will renew for the next year Followup yearly and when necessary  Nilda Riggs, [facility], [facility], APRN  [facility] [address], [address] [place], [place] [address] [phone]

## 2013-03-07 NOTE — Patient Instructions (Addendum)
Continue Keppra at current dose Will renew for the next year Followup yearly and. When necessary

## 2013-10-30 ENCOUNTER — Other Ambulatory Visit: Payer: Self-pay | Admitting: Nurse Practitioner

## 2014-03-07 ENCOUNTER — Ambulatory Visit: Payer: Self-pay | Admitting: Nurse Practitioner

## 2014-03-17 ENCOUNTER — Other Ambulatory Visit: Payer: Self-pay | Admitting: Neurology

## 2014-06-10 ENCOUNTER — Encounter: Payer: Self-pay | Admitting: Nurse Practitioner

## 2014-06-10 ENCOUNTER — Ambulatory Visit (INDEPENDENT_AMBULATORY_CARE_PROVIDER_SITE_OTHER): Payer: Self-pay | Admitting: Nurse Practitioner

## 2014-06-10 VITALS — BP 121/77 | HR 86 | Ht 64.0 in | Wt 154.4 lb

## 2014-06-10 DIAGNOSIS — G40209 Localization-related (focal) (partial) symptomatic epilepsy and epileptic syndromes with complex partial seizures, not intractable, without status epilepticus: Secondary | ICD-10-CM | POA: Insufficient documentation

## 2014-06-10 DIAGNOSIS — R413 Other amnesia: Secondary | ICD-10-CM

## 2014-06-10 MED ORDER — LEVETIRACETAM 500 MG PO TABS: ORAL_TABLET | ORAL | Status: DC

## 2014-06-10 NOTE — Patient Instructions (Signed)
Continue Keppra at current dose will renew for one year, Rx to patient Follow-up yearly and when necessary

## 2014-06-10 NOTE — Progress Notes (Signed)
[facility]  PATIENT: Lindsay Newman DOB: 25-May-1966   REASON FOR VISIT: Follow-up for seizure disorder  HISTORY OF PRESENT ILLNESS: Lindsay Newman , 48 year old returns for follow-up. She was last seen 10/11/11. She is alone at today's visit.  She has past medical history of depression, migraine headaches, right posterior temporal tentorial meningioma, status post resection by Dr. Vertell Limber in November 2008, presenting with complex partial seizure, with secondary generalization.  In 2008, she presenting with frequent headaches, imaging study has confirmed the meningioma, underwent resection uneventfully, which does help her headache, she did not have seizure, pre and post surgrically, until about 9 months ago, she began to have episode of acute onset of dizziness, stomach ache, lasting less than one minute, but there was no generalized tonic-clonic activity, until June 27. She was talking with her Special educational needs teacher, suddenly felt she is going to faint, lost consciousness from then on, she was described to have generalized tonic-clonic seizure activity, last for one minute.  She was taken to the emergency room, CT of the brain has showed postsurgical changes at the right temporal region, no acute lesion, she was started on Keppra 500 b.i.d., she tolerated it very well. TODAY:06/10/14: Lindsay Newman, 48 year old female returns for follow-up. She has a history of seizure disorder with last seizure occurring  02/02/10. She is currently taking Keppra 530m am, 10025mhs. No further events. EEG was abnormal with slowing in right post craniotomy site. No epileptiform discharges seen. She denies lateralized motor or sensory deficit. She returns for reevaluation and refills.    REVIEW OF SYSTEMS: Full 14 system review of systems performed and notable only for those listed, all others are neg:  Constitutional: N/A  Cardiovascular: N/A  Ear/Nose/Throat: N/A  Skin: N/A  Eyes: N/A    Respiratory: N/A  Gastroitestinal: N/A  Hematology/Lymphatic: N/A  Endocrine: N/A Musculoskeletal: Back pain  Allergy/Immunology: N/A  Neurological: Occasional headaches  Psychiatric: N/A Sleep : NA   ALLERGIES: Allergies  Allergen Reactions  . Keflex [Cephalexin]     HOME MEDICATIONS: Outpatient Prescriptions Prior to Visit  Medication Sig Dispense Refill  . levETIRAcetam (KEPPRA) 500 MG tablet TAKE 1 TABLET BY MOUTH EVERY MORNING AND 2 TABLETS AT BEDTIME 90 tablet 2  . LORazepam (ATIVAN) 1 MG tablet Take 1 mg by mouth every 8 (eight) hours as needed for anxiety.    . Marland Kitchenolpidem (AMBIEN) 10 MG tablet Take 10 mg by mouth at bedtime as needed for sleep.     No facility-administered medications prior to visit.    PAST MEDICAL HISTORY: Past Medical History  Diagnosis Date  . Depression with anxiety   . Seizure   . Migraine   . Chronic pain   . Lower back pain     PAST SURGICAL HISTORY: Past Surgical History  Procedure Laterality Date  . Parietal meningeoma    . Ablation arthmia      FAMILY HISTORY: Family History  Problem Relation Age of Onset  . Heart disease      SOCIAL HISTORY: History   Social History  . Marital Status: Divorced    Spouse Name: N/A    Number of Children: 1  . Years of Education: college   Occupational History  . book keTherapist, nutritional  Social History Main Topics  . Smoking status: Current Every Day Smoker -- 1.00 packs/day    Types: Cigarettes  . Smokeless tobacco: Never Used  . Alcohol Use: Yes     Comment: social drinker   .  Drug Use: No  . Sexual Activity: Not on file   Other Topics Concern  . Not on file   Social History Narrative   Patient is a Ship broker.   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   Patient has 2 years of college.   Patient is divorced.   Patient has 1 child.   Patient is left handed.   Patient drinks some caffeine daily.        PHYSICAL EXAM  Filed Vitals:   06/10/14 0859  BP: 121/77  Pulse: 86  Height: 5'  4" (1.626 m)  Weight: 154 lb 6.4 oz (70.035 kg)   Body mass index is 26.49 kg/(m^2). Generalized: Well developed, in no acute distress  Head: normocephalic and atraumatic,. Oropharynx benign   Neurological examination   Mentation: Alert oriented to time, place, history taking. Follows all commands speech and language fluent Cranial nerve II-XII: Pupils were equal round reactive to light extraocular movements were full, visual field were full on confrontational test. Facial sensation and strength were normal. hearing was intact to finger rubbing bilaterally. Uvula tongue midline. head turning and shoulder shrug and were normal and symmetric.Tongue protrusion into cheek strength was normal. Motor: normal bulk and tone, full strength in the BUE, BLE, fine finger movements normal, no pronator drift. No focal weakness Coordination: finger-nose-finger, heel-to-shin bilaterally, no dysmetria Reflexes: Brachioradialis 2/2, biceps 2/2, triceps 2/2, patellar 2/2, Achilles 2/2, plantar responses were flexor bilaterally. Gait and Station: Rising up from seated position without assistance, normal stance, moderate stride, good arm swing, smooth turning, able to perform tiptoe, and heel walking without difficulty.    DIAGNOSTIC DATA (LABS, IMAGING, TESTING) - I reviewed patient records, labs, notes, testing and imaging myself where available.  Lab Results  Component Value Date   WBC  05/04/2007    9.9 **Please note change in CBC/Diff reference range**   HGB 15.3* 12/14/2009   HCT 45.0 12/14/2009   MCV 92.6 05/04/2007   PLT 244 05/04/2007      Component Value Date/Time   NA 139 12/14/2009 1517   K 3.7 12/14/2009 1517   CL 106 12/14/2009 1517   CO2 19 05/04/2007 1900   GLUCOSE 107* 12/14/2009 1517   BUN 17 12/14/2009 1517   CREATININE 1.0 12/14/2009 1517   CALCIUM 8.9 05/04/2007 1900   GFRNONAA >60 05/04/2007 1900   GFRAA  05/04/2007 1900    >60        The eGFR has been calculated using  the MDRD equation. This calculation has not been validated in all clinical    ASSESSMENT AND PLAN  48 y.o. year old female  has a past medical history of Depression with anxiety; Seizure; Migraine; Chronic pain; and Lower back pain. here to follow-up for seizure disorder. Last seizure occurred in 2011.  Continue Keppra at current dose will renew for one year, Rx to patient Follow-up yearly and when necessary Dennie Bible, [facility], [facility], APRN  [facility] [address], [place], [place] [address] [phone]

## 2014-06-18 NOTE — Progress Notes (Signed)
I agree above plan. 

## 2014-06-27 NOTE — Progress Notes (Signed)
I agree above plan. 

## 2015-06-08 ENCOUNTER — Ambulatory Visit: Payer: Self-pay | Admitting: Nurse Practitioner

## 2015-06-16 ENCOUNTER — Ambulatory Visit (INDEPENDENT_AMBULATORY_CARE_PROVIDER_SITE_OTHER): Payer: Self-pay | Admitting: Nurse Practitioner

## 2015-06-16 ENCOUNTER — Encounter: Payer: Self-pay | Admitting: Nurse Practitioner

## 2015-06-16 VITALS — BP 113/81 | HR 82 | Ht 64.0 in | Wt 167.4 lb

## 2015-06-16 DIAGNOSIS — R413 Other amnesia: Secondary | ICD-10-CM

## 2015-06-16 DIAGNOSIS — G40209 Localization-related (focal) (partial) symptomatic epilepsy and epileptic syndromes with complex partial seizures, not intractable, without status epilepticus: Secondary | ICD-10-CM

## 2015-06-16 MED ORDER — LEVETIRACETAM 500 MG PO TABS: ORAL_TABLET | ORAL | Status: DC

## 2015-06-16 NOTE — Progress Notes (Signed)
I have reviewed and agreed above plan. 

## 2015-06-16 NOTE — Progress Notes (Signed)
[facility]  PATIENT: Lindsay Newman DOB: January 15, 1966   REASON FOR VISIT: Follow-up for seizure disorder, HISTORY FROM: Patient    HISTORY OF PRESENT ILLNESS: HISTORY: Lindsay Newman , 49 year old returns for follow-up. She was last seen 10/11/11. She is alone at today's visit. She has past medical history of depression, migraine headaches, right posterior temporal tentorial meningioma, status post resection by Dr. Vertell Limber in November 2008, presenting with complex partial seizure, with secondary generalization. In 2008, she presenting with frequent headaches, imaging study has confirmed the meningioma, underwent resection uneventfully, which does help her headache, she did not have seizure, pre and post surgrically, until about 9 months ago, she began to have episode of acute onset of dizziness, stomach ache, lasting less than one minute, but there was no generalized tonic-clonic activity, until June 27. She was talking with her Special educational needs teacher, suddenly felt she is going to faint, lost consciousness from then on, she was described to have generalized tonic-clonic seizure activity, last for one minute.  She was taken to the emergency room, CT of the brain has showed postsurgical changes at the right temporal region, no acute lesion, she was started on Keppra 500 b.i.d., she tolerated it very well. TODAY:06/16/15: Lindsay. Newman, 48 year old female returns for follow-up. She has a history of seizure disorder with last seizure occurring 02/02/10. She is currently taking Keppra 500mg  am, 1000mg  hs. No further events. EEG was abnormal with slowing in right post craniotomy site. No epileptiform discharges seen. She denies lateralized motor or sensory deficit. She returns for reevaluation and refills. No new medical issues    REVIEW OF SYSTEMS: Full 14 system review of systems performed and notable only for those listed, all others are neg:  Constitutional: neg    Cardiovascular: neg Ear/Nose/Throat: neg  Skin: neg Eyes: neg Respiratory: neg Gastroitestinal: neg  Hematology/Lymphatic: neg  Endocrine: neg Musculoskeletal joint pain Allergy/Immunology: Environmental allergies Neurological: History of seizure disorder Psychiatric: Depression and anxiety Sleep : neg   ALLERGIES: Allergies  Allergen Reactions  . Cortisone     Other reaction(s): INCREASE PAIN  . Duloxetine     Other reaction(s): DEPRESSION  . Keflex [Cephalexin]     HOME MEDICATIONS: Outpatient Prescriptions Prior to Visit  Medication Sig Dispense Refill  . levETIRAcetam (KEPPRA) 500 MG tablet TAKE 1 TABLET BY MOUTH EVERY MORNING AND 2 TABLETS AT BEDTIME 270 tablet 3   No facility-administered medications prior to visit.    PAST MEDICAL HISTORY: Past Medical History  Diagnosis Date  . Depression with anxiety   . Seizure ([place])   . Migraine   . Chronic pain   . Lower back pain     PAST SURGICAL HISTORY: Past Surgical History  Procedure Laterality Date  . Parietal meningeoma    . Ablation arthmia      FAMILY HISTORY: Family History  Problem Relation Age of Onset  . Heart disease    . Pancreatic cancer Mother     07-09-14    SOCIAL HISTORY: Social History   Social History  . Marital Status: Divorced    Spouse Name: N/A  . Number of Children: 1  . Years of Education: college   Occupational History  . book Therapist, nutritional    Social History Main Topics  . Smoking status: Current Every Day Smoker -- 1.00 packs/day    Types: Cigarettes  . Smokeless tobacco: Never Used  . Alcohol Use: Yes     Comment: social drinker   . Drug Use: No  .  Sexual Activity: Not on file   Other Topics Concern  . Not on file   Social History Narrative   Patient is a Ship broker.   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   Patient has 2 years of college.   Patient is divorced.   Patient has 1 child.   Patient is left handed.   Patient drinks some caffeine daily.        PHYSICAL  EXAM  Filed Vitals:   06/16/15 1131  BP: 113/81  Pulse: 82  Height: 5\' 4"  (1.626 m)  Weight: 167 lb 6.4 oz (75.932 kg)   Body mass index is 28.72 kg/(m^2). Generalized: Well developed, in no acute distress  Head: normocephalic and atraumatic,. Oropharynx benign   Neurological examination   Mentation: Alert oriented to time, place, history taking. Follows all commands speech and language fluent Cranial nerve II-XII: Pupils were equal round reactive to light extraocular movements were full, visual field were full on confrontational test. Facial sensation and strength were normal. hearing was intact to finger rubbing bilaterally. Uvula tongue midline. head turning and shoulder shrug and were normal and symmetric.Tongue protrusion into cheek strength was normal. Motor: normal bulk and tone, full strength in the BUE, BLE, fine finger movements normal, no pronator drift. No focal weakness Coordination: finger-nose-finger, heel-to-shin bilaterally, no dysmetria Reflexes: Brachioradialis 2/2, biceps 2/2, triceps 2/2, patellar 2/2, Achilles 2/2, plantar responses were flexor bilaterally. Gait and Station: Rising up from seated position without assistance, normal stance, moderate stride, good arm swing, smooth turning, able to perform tiptoe, and heel walking without difficulty  DIAGNOSTIC DATA (LABS, IMAGING, TESTING) - ASSESSMENT AND PLAN  49 y.o.  female has a past medical history of Depression with anxiety; Seizure; Migraine; Chronic pain; and Lower back pain. here to follow-up for seizure disorder. Last seizure occurred in 2011.  Continue Keppra at current dose will renew for one year Call for some further seizure activity Follow-up yearly and when necessary Dennie Bible, [facility], [facility], APRN  [facility] [address], [place], [place] [address] [phone]

## 2015-06-16 NOTE — Patient Instructions (Signed)
Continue Keppra at current dose will renew for one year, Call for seizure activity  Follow-up yearly and when necessary

## 2016-05-04 ENCOUNTER — Ambulatory Visit (INDEPENDENT_AMBULATORY_CARE_PROVIDER_SITE_OTHER): Payer: Self-pay | Admitting: Family Medicine

## 2016-05-04 VITALS — BP 120/90 | HR 89 | Temp 98.6°F | Resp 20 | Ht 64.0 in | Wt 175.0 lb

## 2016-05-04 DIAGNOSIS — N390 Urinary tract infection, site not specified: Secondary | ICD-10-CM

## 2016-05-04 MED ORDER — FLUCONAZOLE 150 MG PO TABS: 150.0000 mg | ORAL_TABLET | Freq: Every day | ORAL | 1 refills | Status: DC

## 2016-05-04 MED ORDER — SULFAMETHOXAZOLE-TRIMETHOPRIM 800-160 MG PO TABS: 1.0000 | ORAL_TABLET | Freq: Two times a day (BID) | ORAL | 0 refills | Status: AC

## 2016-05-04 NOTE — Progress Notes (Signed)
Urinary Tract Infection Patient complains of burning with urination, dysuria, frequency, suprapubic pressure and urgency. She has had symptoms for 2 day. Patient also complains of negative CVA tenderness. Patient denies back pain and fever. Patient does not have a history of recurrent UTI. Patient does not have a history of pyelonephritis.

## 2016-05-04 NOTE — Patient Instructions (Addendum)
Urinary Tract Infection, Adult Introduction A urinary tract infection (UTI) is an infection of any part of the urinary tract. The urinary tract includes the:  Kidneys.  Ureters.  Bladder.  Urethra. These organs make, store, and get rid of pee (urine) in the body. Follow these instructions at home:  Take over-the-counter and prescription medicines only as told by your doctor.  If you were prescribed an antibiotic medicine, take it as told by your doctor. Do not stop taking the antibiotic even if you start to feel better.  Avoid the following drinks:  Alcohol.  Caffeine.  Tea.  Carbonated drinks.  Drink enough fluid to keep your pee clear or pale yellow.  Keep all follow-up visits as told by your doctor. This is important.  Make sure to:  Empty your bladder often and completely. Do not to hold pee for long periods of time.  Empty your bladder before and after sex.  Wipe from front to back after a bowel movement if you are female. Use each tissue one time when you wipe. Contact a doctor if:  You have back pain.  You have a fever.  You feel sick to your stomach (nauseous).  You throw up (vomit).  Your symptoms do not get better after 3 days.  Your symptoms go away and then come back. Get help right away if:  You have very bad back pain.  You have very bad lower belly (abdominal) pain.  You are throwing up and cannot keep down any medicines or water. This information is not intended to replace advice given to you by your health care provider. Make sure you discuss any questions you have with your health care provider. Document Released: 11/23/2007 Document Revised: 11/12/2015 Document Reviewed: 04/27/2015  2017 Elsevier  

## 2016-05-04 NOTE — Progress Notes (Signed)
   Subjective:    Patient ID: Lindsay Newman, female    DOB: 01-May-1966, 50 y.o.   MRN: [id number]   HPI Pt presents with urinary/freq/urgency/discomfort/denies visible bleeding/started yesterday/has taken AZO with some relief. Pt denies hx of freq UTIs /denies fever    Review of Systems  Constitutional: Negative.   Respiratory: Negative.   Cardiovascular: Negative.   Genitourinary: Positive for dysuria, frequency and urgency.  Psychiatric/Behavioral: Negative.        Objective:   Physical Exam  Constitutional: She is oriented to person, place, and time. She appears well-developed and well-nourished.  Cardiovascular: Normal rate, regular rhythm and normal heart sounds.   Pulmonary/Chest: Effort normal and breath sounds normal.  Abdominal: Soft. Bowel sounds are normal.  Genitourinary:  Genitourinary Comments: Suprapubic tenderness/neg CVA tenderness/pain level 5  Neurological: She is alert and oriented to person, place, and time.  Skin: Skin is warm and dry.  Psychiatric: She has a normal mood and affect. Her behavior is normal. Judgment and thought content normal.          Assessment & Plan:  See pt instructions/take meds as directed/Diflucan as needed/increase hydrating fluids/Ibuprofen/tylenol as needed

## 2016-05-05 ENCOUNTER — Telehealth: Payer: Self-pay | Admitting: *Deleted

## 2016-05-05 NOTE — Telephone Encounter (Signed)
Left message on machine for patient with a follow up call to see if her symptoms have improved.

## 2016-06-02 ENCOUNTER — Ambulatory Visit (INDEPENDENT_AMBULATORY_CARE_PROVIDER_SITE_OTHER): Payer: Self-pay | Admitting: Nurse Practitioner

## 2016-06-02 ENCOUNTER — Encounter: Payer: Self-pay | Admitting: Nurse Practitioner

## 2016-06-02 VITALS — BP 139/84 | HR 87 | Ht 64.0 in | Wt 174.4 lb

## 2016-06-02 DIAGNOSIS — G40209 Localization-related (focal) (partial) symptomatic epilepsy and epileptic syndromes with complex partial seizures, not intractable, without status epilepticus: Secondary | ICD-10-CM

## 2016-06-02 MED ORDER — LEVETIRACETAM 500 MG PO TABS: ORAL_TABLET | ORAL | 11 refills | Status: DC

## 2016-06-02 NOTE — Progress Notes (Signed)
[facility]  PATIENT: Lindsay Newman DOB: 1966/06/13   REASON FOR VISIT: Follow-up for seizure disorder, HISTORY FROM: Patient    HISTORY OF PRESENT ILLNESS: HISTORY: Lindsay Newman , 50 year old returns for follow-up. She was last seen 10/11/11. She is alone at today's visit. She has past medical history of depression, migraine headaches, right posterior temporal tentorial meningioma, status post resection by Dr. Vertell Limber in November 2008, presenting with complex partial seizure, with secondary generalization. In 2008, she presenting with frequent headaches, imaging study has confirmed the meningioma, underwent resection uneventfully, which does help her headache, she did not have seizure, pre and post surgrically, until about 9 months ago, she began to have episode of acute onset of dizziness, stomach ache, lasting less than one minute, but there was no generalized tonic-clonic activity, until June 27. She was talking with her Special educational needs teacher, suddenly felt she is going to faint, lost consciousness from then on, she was described to have generalized tonic-clonic seizure activity, last for one minute.  She was taken to the emergency room, CT of the brain has showed postsurgical changes at the right temporal region, no acute lesion, she was started on Keppra 500 b.i.d., she tolerated it very well. TODAY:06/12/16: Lindsay Newman, 50 year old female returns for yearly follow up.  She has a history of seizure disorder with last seizure occurring 02/02/10. She is currently taking Keppra 500mg  am, 1000mg  hs. No further events. EEG was abnormal with slowing in right post craniotomy site. No epileptiform discharges seen. She denies lateralized motor or sensory deficit. She returns for reevaluation and refills. No new medical issues. She does not have medical insurance    REVIEW OF SYSTEMS: Full 14 system review of systems performed and notable only for those listed, all others  are neg:  Constitutional: neg  Cardiovascular: neg Ear/Nose/Throat: neg  Skin: neg Eyes: neg Respiratory: neg Gastroitestinal: neg  Hematology/Lymphatic: neg  Endocrine: neg Musculoskeletal joint pain Allergy/Immunology: Environmental allergies Neurological: History of seizure disorder Psychiatric: neg Sleep : neg   ALLERGIES: Allergies  Allergen Reactions  . Cortisone     Other reaction(s): INCREASE PAIN  . Duloxetine     Other reaction(s): DEPRESSION  . Keflex [Cephalexin]     HOME MEDICATIONS: Outpatient Medications Prior to Visit  Medication Sig Dispense Refill  . Aspirin-Acetaminophen-Caffeine (EXCEDRIN MIGRAINE PO) Take by mouth.    . Biotin 1 MG CAPS Take by mouth.    . cetirizine (ZYRTEC) 5 MG tablet Take 5 mg by mouth.    . levETIRAcetam (KEPPRA) 500 MG tablet TAKE 1 TABLET BY MOUTH EVERY MORNING AND 2 TABLETS AT BEDTIME 270 tablet 3  . Loperamide HCl (IMODIUM A-D PO) Take by mouth.    . Multiple Vitamin (MULTIVITAMIN) capsule Take by mouth.    . escitalopram (LEXAPRO) 10 MG tablet Take one a day for one week and then may increase to 2 pills a day    . fluconazole (DIFLUCAN) 150 MG tablet Take 1 tablet (150 mg total) by mouth daily. (Patient not taking: Reported on 06/02/2016) 1 tablet 1   No facility-administered medications prior to visit.     PAST MEDICAL HISTORY: Past Medical History:  Diagnosis Date  . Chronic pain   . Depression with anxiety   . Lower back pain   . Migraine   . Seizure ([place])     PAST SURGICAL HISTORY: Past Surgical History:  Procedure Laterality Date  . ablation arthmia    . parietal meningeoma  FAMILY HISTORY: Family History  Problem Relation Age of Onset  . Heart disease    . Pancreatic cancer Mother     07-09-14    SOCIAL HISTORY: Social History   Social History  . Marital status: Divorced    Spouse name: N/A  . Number of children: 1  . Years of education: college   Occupational History  . book Therapist, nutritional      Social History Main Topics  . Smoking status: Current Every Day Smoker    Packs/day: 1.00    Types: Cigarettes  . Smokeless tobacco: Never Used  . Alcohol use Yes     Comment: social drinker   . Drug use: No  . Sexual activity: Not on file   Other Topics Concern  . Not on file   Social History Narrative   Patient is a Ship broker.   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   Patient has 2 years of college.   Patient is divorced.   Patient has 1 child.   Patient is left handed.   Patient drinks some caffeine daily.        PHYSICAL EXAM  Vitals:   06/02/16 1326  BP: 139/84  Pulse: 87  Weight: 174 lb 6.4 oz (79.1 kg)  Height: 5\' 4"  (1.626 m)   Body mass index is 29.94 kg/m. Generalized: Well developed, in no acute distress  Head: normocephalic and atraumatic,. Oropharynx benign   Neurological examination   Mentation: Alert oriented to time, place, history taking. Follows all commands speech and language fluent Cranial nerve II-XII: Pupils were equal round reactive to light extraocular movements were full, visual field were full on confrontational test. Facial sensation and strength were normal. hearing was intact to finger rubbing bilaterally. Uvula tongue midline. head turning and shoulder shrug and were normal and symmetric.Tongue protrusion into cheek strength was normal. Motor: normal bulk and tone, full strength in the BUE, BLE, fine finger movements normal, no pronator drift. No focal weakness Coordination: finger-nose-finger, heel-to-shin bilaterally, no dysmetria Reflexes: Brachioradialis 2/2, biceps 2/2, triceps 2/2, patellar 2/2, Achilles 2/2, plantar responses were flexor bilaterally. Gait and Station: Rising up from seated position without assistance, normal stance, moderate stride, good arm swing, smooth turning, able to perform tiptoe, and heel walking without difficulty  DIAGNOSTIC DATA (LABS, IMAGING, TESTING) - ASSESSMENT AND PLAN  50 y.o.  female has a past  medical history of Depression with anxiety; Seizure; Migraine; Chronic pain; and Lower back pain. here to follow-up for seizure disorder. Last seizure occurred in 2011.  Continue Keppra at current dose will renew for one year Call for some further seizure activity Follow-up yearly and when necessary Dennie Bible, [facility], [facility], APRN  [facility] [address] [place], [place] [address] [phone]

## 2016-06-02 NOTE — Patient Instructions (Signed)
Continue Keppra at current dose will renew for one year Call for some further seizure activity Follow-up yearly and when necessary

## 2016-06-02 NOTE — Progress Notes (Signed)
I have reviewed and agreed above plan. 

## 2017-05-31 NOTE — Progress Notes (Signed)
[facility]  PATIENT: Lindsay Newman DOB: [birth year]   REASON FOR VISIT: Follow-up for seizure disorder, HISTORY FROM: Patient    HISTORY OF PRESENT ILLNESS: HISTORY: Lindsay Newman , 51 year old returns for follow-up. She was last seen 10/11/11. She is alone at today's visit. She has past medical history of depression, migraine headaches, right posterior temporal tentorial meningioma, status post resection by Dr. Vertell Limber in November 2008, presenting with complex partial seizure, with secondary generalization. In 2008, she presenting with frequent headaches, imaging study has confirmed the meningioma, underwent resection uneventfully, which does help her headache, she did not have seizure, pre and post surgrically, until about 9 months ago, she began to have episode of acute onset of dizziness, stomach ache, lasting less than one minute, but there was no generalized tonic-clonic activity, until June 27. She was talking with her Special educational needs teacher, suddenly felt she is going to faint, lost consciousness from then on, she was described to have generalized tonic-clonic seizure activity, last for one minute.  She was taken to the emergency room, CT of the brain has showed postsurgical changes at the right temporal region, no acute lesion, she was started on Keppra 500 b.i.d., she tolerated it very well. TODAY:06/12/16: Lindsay Newman, 51 year old female returns for yearly follow up.  She has a history of seizure disorder with last seizure occurring 02/02/10. She is currently taking Keppra500mg  every morning and 1000mg   at bedtime. No seizure events. EEG was abnormal with slowing in right post craniotomy site. No epileptiform discharges seen. She denies lateralized motor or sensory deficit. She returns for reevaluation and refills. No new medical issues. She does not have medical insurance UPDATE 12/13/2018CM Lindsay Newman, 51 year old female returns for yearly follow-up with history  of seizure disorder.  Her last seizure occurred in 2011.  She is currently taking Keppra 500mg  every morning and 1000mg   at bedtime. No seizure events.  She returns for reevaluation and refills.  She denies new medical issues. EEG was abnormal with slowing in right post craniotomy site. 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  She returns for reevaluation   REVIEW OF SYSTEMS: Full 14 system review of systems performed and notable only for those listed, all others are neg:  Constitutional: neg  Cardiovascular: neg Ear/Nose/Throat: neg  Skin: neg Eyes: neg Respiratory: neg Gastroitestinal: neg  Hematology/Lymphatic: neg  Endocrine: neg Musculoskeletal joint pain Allergy/Immunology: Environmental allergies Neurological: History of seizure disorder Psychiatric: neg Sleep : neg   ALLERGIES: Allergies  Allergen Reactions  . Cortisone     Other reaction(s): INCREASE PAIN  . Duloxetine     Other reaction(s): DEPRESSION  . Keflex [Cephalexin]     HOME MEDICATIONS: Outpatient Medications Prior to Visit  Medication Sig Dispense Refill  . Aspirin-Acetaminophen-Caffeine (EXCEDRIN MIGRAINE PO) Take by mouth.    . cetirizine (ZYRTEC) 5 MG tablet Take 5 mg by mouth.    . levETIRAcetam (KEPPRA) 500 MG tablet TAKE 1 TABLET BY MOUTH EVERY MORNING AND 2 TABLETS AT BEDTIME 90 tablet 11  . Loperamide HCl (IMODIUM A-D PO) Take by mouth.    . Biotin 1 MG CAPS Take by mouth.    . Multiple Vitamin (MULTIVITAMIN) capsule Take by mouth.     No facility-administered medications prior to visit.     PAST MEDICAL HISTORY: Past Medical History:  Diagnosis Date  . Chronic pain   . Depression with anxiety   . Lower back pain   . Migraine   .  Seizure ([place])     PAST SURGICAL HISTORY: Past Surgical History:  Procedure Laterality Date  . ablation arthmia    . parietal meningeoma      FAMILY HISTORY: Family History  Problem Relation Age of Onset  .  Pancreatic cancer Mother        07-09-14  . Heart disease Unknown     SOCIAL HISTORY: Social History   Socioeconomic History  . Marital status: Divorced    Spouse name: Not on file  . Number of children: 1  . Years of education: college  . Highest education level: Not on file  Social Needs  . Financial resource strain: Not on file  . Food insecurity - worry: Not on file  . Food insecurity - inability: Not on file  . Transportation needs - medical: Not on file  . Transportation needs - non-medical: Not on file  Occupational History  . Occupation: Clinical cytogeneticist  Tobacco Use  . Smoking status: Current Every Day Smoker    Packs/day: 1.00    Types: Cigarettes  . Smokeless tobacco: Never Used  Substance and Sexual Activity  . Alcohol use: Yes    Comment: social drinker   . Drug use: No  . Sexual activity: Not on file  Other Topics Concern  . Not on file  Social History Narrative   Patient is a Ship broker.   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   Patient has 2 years of college.   Patient is divorced.   Patient has 1 child.   Patient is left handed.   Patient drinks some caffeine daily.     PHYSICAL EXAM  Vitals:   06/01/17 1357  BP: 123/79  Pulse: 91  Weight: 170 lb (77.1 kg)  Height: 5\' 4"  (1.626 m)   Body mass index is 29.18 kg/m. Generalized: Well developed, in no acute distress  Head: normocephalic and atraumatic,. Oropharynx benign   Neurological examination   Mentation: Alert oriented to time, place, history taking. Follows all commands speech and language fluent Cranial nerve II-XII: Pupils were equal round reactive to light extraocular movements were full, visual field were full on confrontational test. Facial sensation and strength were normal. hearing was intact to finger rubbing bilaterally. Uvula tongue midline. head turning and shoulder shrug and were normal and symmetric.Tongue protrusion into cheek strength was normal. Motor: normal bulk and tone, full  strength in the BUE, BLE, fine finger movements normal, no pronator drift. No focal weakness Coordination: finger-nose-finger, heel-to-shin bilaterally, no dysmetria Reflexes: Brachioradialis 2/2, biceps 2/2, triceps 2/2, patellar 2/2, Achilles 2/2, plantar responses were flexor bilaterally. Gait and Station: Rising up from seated position without assistance, normal stance, moderate stride, good arm swing, smooth turning, able to perform tiptoe, and heel walking without difficulty  DIAGNOSTIC DATA (LABS, IMAGING, TESTING) - ASSESSMENT AND PLAN  51 y.o.  female has a past medical history of Depression with anxiety; Seizure; Migraine; Chronic pain; and Lower back pain. here to follow-up for seizure disorder. Last seizure occurred in 2011.  Continue Keppra at current dose will renew for one year Call for  seizure activity Follow-up yearly and when necessary Reviewed common seizure triggers Lindsay Newman, [facility], [facility], APRN  [facility] [address] [place], [place] [address] [phone]

## 2017-06-01 ENCOUNTER — Encounter: Payer: Self-pay | Admitting: Nurse Practitioner

## 2017-06-01 ENCOUNTER — Ambulatory Visit: Payer: Self-pay | Admitting: Nurse Practitioner

## 2017-06-01 VITALS — BP 123/79 | HR 91 | Ht 64.0 in | Wt 170.0 lb

## 2017-06-01 DIAGNOSIS — G40209 Localization-related (focal) (partial) symptomatic epilepsy and epileptic syndromes with complex partial seizures, not intractable, without status epilepticus: Secondary | ICD-10-CM

## 2017-06-01 MED ORDER — LEVETIRACETAM 500 MG PO TABS: ORAL_TABLET | ORAL | 3 refills | Status: DC

## 2017-06-01 NOTE — Patient Instructions (Signed)
Continue Keppra at current dose will renew for one year Call for  seizure activity Follow-up yearly and when necessary 

## 2017-06-02 NOTE — Progress Notes (Signed)
I have reviewed and agreed above plan. 

## 2018-06-05 NOTE — Progress Notes (Signed)
[facility]  PATIENT: Lindsay Newman DOB: [birth year]   REASON FOR VISIT: Follow-up for seizure disorder, HISTORY FROM: Patient    HISTORY OF PRESENT ILLNESS: HISTORY: Lindsay Newman , 52 year old returns for follow-up. She was last seen 10/11/11. She is alone at today's visit. She has past medical history of depression, migraine headaches, right posterior temporal tentorial meningioma, status post resection by Dr. Vertell Limber in November 2008, presenting with complex partial seizure, with secondary generalization. In 2008, she presenting with frequent headaches, imaging study has confirmed the meningioma, underwent resection uneventfully, which does help her headache, she did not have seizure, pre and post surgrically, until about 9 months ago, she began to have episode of acute onset of dizziness, stomach ache, lasting less than one minute, but there was no generalized tonic-clonic activity, until June 27. She was talking with her Special educational needs teacher, suddenly felt she is going to faint, lost consciousness from then on, she was described to have generalized tonic-clonic seizure activity, last for one minute.  She was taken to the emergency room, CT of the brain has showed postsurgical changes at the right temporal region, no acute lesion, she was started on Keppra 500 b.i.d., she tolerated it very well. TODAY:06/12/16: Lindsay Newman, 52 year old female returns for yearly follow up.  She has a history of seizure disorder with last seizure occurring 02/02/10. She is currently taking Keppra500mg  every morning and 1000mg   at bedtime. No seizure events. EEG was abnormal with slowing in right post craniotomy site. No epileptiform discharges seen. She denies lateralized motor or sensory deficit. She returns for reevaluation and refills. No new medical issues. She does not have medical insurance UPDATE 12/13/2018CM Lindsay Newman, 52 year old female returns for yearly follow-up with history  of seizure disorder.  Her last seizure occurred in 2011.  She is currently taking Keppra 500mg  every morning and 1000mg   at bedtime. No seizure events.  She returns for reevaluation and refills.  She denies new medical issues. EEG was abnormal with slowing in right post craniotomy site. 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  She returns for reevaluation UPDATE 12/18/2019CM Lindsay Newman, 52 year old female returns for follow-up with history of seizure disorder.  Last seizure occurred in 2011.  She is currently on Keppra 500mg  every am and 1000mg  at night.  She denies any side effects to the medication.  No new medical issues since last seen she returns for reevaluation and refills  REVIEW OF SYSTEMS: Full 14 system review of systems performed and notable only for those listed, all others are neg:  Constitutional: neg  Cardiovascular: neg Ear/Nose/Throat: neg  Skin: neg Eyes: neg Respiratory: neg Gastroitestinal: neg  Hematology/Lymphatic: neg  Endocrine: neg Musculoskeletal joint pain Allergy/Immunology: Environmental allergies Neurological: History of seizure disorder Psychiatric: neg Sleep : neg   ALLERGIES: Allergies  Allergen Reactions  . Cortisone     Other reaction(s): INCREASE PAIN  . Duloxetine     Other reaction(s): DEPRESSION  . Keflex [Cephalexin]     HOME MEDICATIONS: Outpatient Medications Prior to Visit  Medication Sig Dispense Refill  . Aspirin-Acetaminophen-Caffeine (EXCEDRIN MIGRAINE PO) Take by mouth.    . cetirizine (ZYRTEC) 5 MG tablet Take 5 mg by mouth.    . levETIRAcetam (KEPPRA) 500 MG tablet TAKE 1 TABLET BY MOUTH EVERY MORNING AND 2 TABLETS AT BEDTIME 270 tablet 3  . Loperamide HCl (IMODIUM A-D PO) Take by mouth.     No facility-administered medications prior to visit.  PAST MEDICAL HISTORY: Past Medical History:  Diagnosis Date  . Chronic pain   . Depression with anxiety   . Lower back pain   . Migraine    . Seizure ([place])     PAST SURGICAL HISTORY: Past Surgical History:  Procedure Laterality Date  . ablation arthmia    . parietal meningeoma      FAMILY HISTORY: Family History  Problem Relation Age of Onset  . Pancreatic cancer Mother        07-09-14  . Heart disease Other     SOCIAL HISTORY: Social History   Socioeconomic History  . Marital status: Divorced    Spouse name: Not on file  . Number of children: 1  . Years of education: college  . Highest education level: Not on file  Occupational History  . Occupation: Clinical cytogeneticist  Social Needs  . Financial resource strain: Not on file  . Food insecurity:    Worry: Not on file    Inability: Not on file  . Transportation needs:    Medical: Not on file    Non-medical: Not on file  Tobacco Use  . Smoking status: Current Every Day Smoker    Packs/day: 1.00    Types: Cigarettes  . Smokeless tobacco: Never Used  Substance and Sexual Activity  . Alcohol use: Yes    Comment: social drinker   . Drug use: No  . Sexual activity: Not on file  Lifestyle  . Physical activity:    Days per week: Not on file    Minutes per session: Not on file  . Stress: Not on file  Relationships  . Social connections:    Talks on phone: Not on file    Gets together: Not on file    Attends religious service: Not on file    Active member of club or organization: Not on file    Attends meetings of clubs or organizations: Not on file    Relationship status: Not on file  . Intimate partner violence:    Fear of current or ex partner: Not on file    Emotionally abused: Not on file    Physically abused: Not on file    Forced sexual activity: Not on file  Other Topics Concern  . Not on file  Social History Narrative   Patient is a Ship broker.   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   Patient has 2 years of college.   Patient is divorced.   Patient has 1 child.   Patient is left handed.   Patient drinks some caffeine daily.     PHYSICAL  EXAM  Vitals:   06/06/18 1354  BP: 114/73  Pulse: 94  Weight: 156 lb 12.8 oz (71.1 kg)  Height: 5\' 4"  (1.626 m)   Body mass index is 26.91 kg/m. Generalized: Well developed, in no acute distress  Head: normocephalic and atraumatic,. Oropharynx benign   Neurological examination   Mentation: Alert oriented to time, place, history taking. Follows all commands speech and language fluent Cranial nerve II-XII: Pupils were equal round reactive to light extraocular movements were full, visual field were full on confrontational test. Facial sensation and strength were normal. hearing was intact to finger rubbing bilaterally. Uvula tongue midline. head turning and shoulder shrug and were normal and symmetric.Tongue protrusion into cheek strength was normal. Motor: normal bulk and tone, full strength in the BUE, BLE, fine finger movements normal, no pronator drift. No focal weakness Coordination: finger-nose-finger, heel-to-shin bilaterally, no dysmetria Reflexes: Symmetric upper  and lower plantar responses were flexor bilaterally. Gait and Station: Rising up from seated position without assistance, normal stance, moderate stride, good arm swing, smooth turning, able to perform tiptoe, and heel walking without difficulty  DIAGNOSTIC DATA (LABS, IMAGING, TESTING) - ASSESSMENT AND PLAN  52 y.o.  female has a past medical history of Depression with anxiety; Seizure; Migraine; Chronic pain; and Lower back pain. here to follow-up for seizure disorder. Last seizure occurred in 2011.  Continue Keppra at current dose will renew for one year Call for  seizure activity Follow-up yearly and when necessary Dennie Bible, [facility], [facility], APRN  [facility] [address] [place], [place] [address] [phone]

## 2018-06-06 ENCOUNTER — Encounter: Payer: Self-pay | Admitting: Nurse Practitioner

## 2018-06-06 ENCOUNTER — Ambulatory Visit: Payer: Self-pay | Admitting: Nurse Practitioner

## 2018-06-06 VITALS — BP 114/73 | HR 94 | Ht 64.0 in | Wt 156.8 lb

## 2018-06-06 DIAGNOSIS — G40209 Localization-related (focal) (partial) symptomatic epilepsy and epileptic syndromes with complex partial seizures, not intractable, without status epilepticus: Secondary | ICD-10-CM

## 2018-06-06 MED ORDER — LEVETIRACETAM 500 MG PO TABS: ORAL_TABLET | ORAL | 3 refills | Status: DC

## 2018-06-06 NOTE — Patient Instructions (Signed)
Continue Keppra at current dose will renew for one year Call for  seizure activity Follow-up yearly and when necessary

## 2018-06-07 NOTE — Progress Notes (Signed)
I have reviewed and agreed above plan. 

## 2019-06-26 ENCOUNTER — Ambulatory Visit: Payer: Self-pay | Admitting: Neurology

## 2019-09-12 ENCOUNTER — Ambulatory Visit: Payer: Self-pay | Admitting: Neurology

## 2019-09-12 ENCOUNTER — Encounter: Payer: Self-pay | Admitting: Neurology

## 2019-09-12 ENCOUNTER — Other Ambulatory Visit: Payer: Self-pay

## 2019-09-12 VITALS — BP 127/78 | HR 76 | Temp 97.0°F | Ht 64.0 in | Wt 165.0 lb

## 2019-09-12 DIAGNOSIS — G40209 Localization-related (focal) (partial) symptomatic epilepsy and epileptic syndromes with complex partial seizures, not intractable, without status epilepticus: Secondary | ICD-10-CM

## 2019-09-12 MED ORDER — LEVETIRACETAM 500 MG PO TABS: ORAL_TABLET | ORAL | 3 refills | Status: AC

## 2019-09-12 NOTE — Progress Notes (Signed)
PATIENT: Lindsay Newman DOB: February 23, 1966  REASON FOR VISIT: follow up HISTORY FROM: patient  HISTORY OF PRESENT ILLNESS: Today 09/12/19  HISTORY HISTORY OF PRESENT ILLNESS: HISTORY: Ms Lindsay Newman , 54 year old returns for follow-up. She was last seen 10/11/11. She is alone at today's visit. She has past medical history of depression, migraine headaches, right posterior temporal tentorial meningioma, status post resection by Dr. Vertell Limber in November 2008, presenting with complex partial seizure, with secondary generalization. In 2008, she presenting with frequent headaches, imaging study has confirmed the meningioma, underwent resection uneventfully, which does help her headache, she did not have seizure, pre and post surgrically, until about 9 months ago, she began to have episode of acute onset of dizziness, stomach ache, lasting less than one minute, but there was no generalized tonic-clonic activity, until June 27. She was talking with her Special educational needs teacher, suddenly felt she is going to faint, lost consciousness from then on, she was described to have generalized tonic-clonic seizure activity, last for one minute.  She was taken to the emergency room, CT of the brain has showed postsurgical changes at the right temporal region, no acute lesion, she was started on Keppra 500 b.i.d., she tolerated it very well. TODAY:06/12/16: Ms. Lindsay Newman, 54 year old female returns for yearly follow up.  She has a history of seizure disorder with last seizure occurring 02/02/10. She is currently taking Keppra579m every morning and 10042m at bedtime. No seizure events. EEG was abnormal with slowing in right post craniotomy site. No epileptiform discharges seen. She denies lateralized motor or sensory deficit. She returns for reevaluation and refills. No new medical issues. She does not have medical insurance UPDATE 12/13/2018CM [name] old female returns for yearly follow-up with history of  seizure disorder.  Her last seizure occurred in 2011.  She is currently taking Keppra 50032mvery morning and 1000m60mt bedtime. No seizure events.  She returns for reevaluation and refills.  She denies new medical issues. EEG was abnormal with slowing in right post craniotomy site. 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 Lindsay Newman y73r old female returns for follow-up with history of seizure disorder.  Last seizure occurred in 2011.  She is currently on Keppra 500mg74mry am and 1000mg 36might.  She denies any side effects to the medication.  No new medical issues since last seen she returns for reevaluation and refills   Update September 12, 2019 SS: She is here today alone for follow-up for seizure disorder.  Her last seizure occurred in 2011.  She remains on Keppra 500 mg in the morning, 1000 mg in the evening.  She is tolerating without side effect.  She denies any new problems or concerns.  She does not have a PCP, does not have health insurance.  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  Overall, doing well.  REVIEW OF SYSTEMS: Out of a complete 14 system review of symptoms, the patient complains only of the following symptoms, and all other reviewed systems are negative.  Seizures  ALLERGIES: Allergies  Allergen Reactions  . Cortisone     Other reaction(s): INCREASE PAIN  . Duloxetine     Other reaction(s): DEPRESSION  . Keflex [Cephalexin]     HOME MEDICATIONS: Outpatient Medications Prior to Visit  Medication Sig Dispense Refill  . Aspirin-Acetaminophen-Caffeine (EXCEDRIN MIGRAINE PO) Take by mouth.    . cetirizine (ZYRTEC) 5 MG  tablet Take 5 mg by mouth.    . levETIRAcetam (KEPPRA) 500 MG tablet TAKE 1 TABLET BY MOUTH EVERY MORNING AND 2 TABLETS AT BEDTIME 270 tablet 3  . Loperamide HCl (IMODIUM A-D PO) Take by mouth.     No facility-administered medications prior  to visit.    PAST MEDICAL HISTORY: Past Medical History:  Diagnosis Date  . Chronic pain   . Depression with anxiety   . Lower back pain   . Migraine   . Seizure ([place])     PAST SURGICAL HISTORY: Past Surgical History:  Procedure Laterality Date  . ablation arthmia    . parietal meningeoma      FAMILY HISTORY: Family History  Problem Relation Age of Onset  . Pancreatic cancer Mother        07-09-14  . Heart disease Other     SOCIAL HISTORY: Social History   Socioeconomic History  . Marital status: Divorced    Spouse name: Not on file  . Number of children: 1  . Years of education: college  . Highest education level: Not on file  Occupational History  . Occupation: Clinical cytogeneticist  Tobacco Use  . Smoking status: Current Every Day Smoker    Packs/day: 1.00    Types: Cigarettes  . Smokeless tobacco: Never Used  Substance and Sexual Activity  . Alcohol use: Yes    Comment: social drinker   . Drug use: No  . Sexual activity: Not on file  Other Topics Concern  . Not on file  Social History Narrative   Patient is a Ship broker.   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   Patient has 2 years of college.   Patient is divorced.   Patient has 1 child.   Patient is left handed.   Patient drinks some caffeine daily.   Social Determinants of Health   Financial Resource Strain:   . Difficulty of Paying Living Expenses:   Food Insecurity:   . Worried About Charity fundraiser in the Last Year:   . Arboriculturist in the Last Year:   Transportation Needs:   . Film/video editor (Medical):   Marland Kitchen Lack of Transportation (Non-Medical):   Physical Activity:   . Days of Exercise per Week:   . Minutes of Exercise per Session:   Stress:   . Feeling of Stress :   Social Connections:   . Frequency of Communication with Friends and Family:   . Frequency of Social Gatherings with Friends and Family:   . Attends Religious Services:   . Active Member of Clubs or Organizations:   .  Attends Archivist Meetings:   Marland Kitchen Marital Status:   Intimate Partner Violence:   . Fear of Current or Ex-Partner:   . Emotionally Abused:   Marland Kitchen Physically Abused:   . Sexually Abused:    PHYSICAL EXAM  Vitals:   09/12/19 1056  BP: 127/78  Pulse: 76  Temp: (!) 97 F (36.1 C)  Weight: 165 lb (74.8 kg)  Height: '5\' 4"'  (1.626 m)   Body mass index is 28.32 kg/m.  Generalized: Well developed, in no acute distress   Neurological examination  Mentation: Alert oriented to time, place, history taking. Follows all commands speech and language fluent Cranial nerve II-XII: Pupils were equal round reactive to light. Extraocular movements were full, visual field were full on confrontational test. Facial sensation and strength were normal.  Head turning and shoulder shrug  were normal and symmetric. Motor: The  motor testing reveals 5 over 5 strength of all 4 extremities. Good symmetric motor tone is noted throughout.  Sensory: Sensory testing is intact to soft touch on all 4 extremities. No evidence of extinction is noted.  Coordination: Cerebellar testing reveals good finger-nose-finger and heel-to-shin bilaterally.  Gait and station: Gait is normal. Tandem gait is normal. Romberg is negative. No drift is seen.  Reflexes: Deep tendon reflexes are symmetric and normal bilaterally.   DIAGNOSTIC DATA (LABS, IMAGING, TESTING) - I reviewed patient records, labs, notes, testing and imaging myself where available.  Lab Results  Component Value Date   WBC  05/04/2007    9.9 **Please note change in CBC/Diff reference range**   HGB 15.3 (H) 12/14/2009   HCT 45.0 12/14/2009   MCV 92.6 05/04/2007   PLT 244 05/04/2007      Component Value Date/Time   NA 139 12/14/2009 1517   K 3.7 12/14/2009 1517   CL 106 12/14/2009 1517   CO2 19 05/04/2007 1900   GLUCOSE 107 (H) 12/14/2009 1517   BUN 17 12/14/2009 1517   CREATININE 1.0 12/14/2009 1517   CALCIUM 8.9 05/04/2007 1900   GFRNONAA >60  05/04/2007 1900   GFRAA  05/04/2007 1900    >60        The eGFR has been calculated using the MDRD equation. This calculation has not been validated in all clinical   No results found for: CHOL, HDL, LDLCALC, LDLDIRECT, TRIG, CHOLHDL No results found for: HGBA1C No results found for: VITAMINB12 No results found for: TSH    ASSESSMENT AND PLAN 54 y.o. year old female  has a past medical history of Chronic pain, Depression with anxiety, Lower back pain, Migraine, and Seizure ([place]). here with:  1.  History of seizures, well controlled -Her last seizure occurred in 2011 -Continue Keppra 500 mg in the am, 1000 mg in the pm, refill for 1 year -We will [facility] for [facility] care, does not have health insurance, if establishes with PCP, can follow-up at our office on as-needed basis -Call for recurrent seizure, otherwise return in 1 year   I spent 20 minutes of face-to-face and non-face-to-face time with patient.  This included previsit chart review, lab review, study review, order entry, electronic health record documentation, patient education.  Butler Denmark, AGNP-C, DNP 09/12/2019, 11:14 AM [facility] [address], [place], [place] [address] [phone]

## 2019-09-12 NOTE — Progress Notes (Signed)
I have reviewed and agreed above plan. 

## 2019-09-12 NOTE — Patient Instructions (Signed)
Continue Keppra  Call for seizure  Will refer you for primary care  See you in 1 year, if find PCP, they can fill medicine for you
